# Patient Record
Sex: Female | Born: 1975 | Race: White | Hispanic: No | Marital: Married | Smoking: Former smoker
Health system: Southern US, Community
[De-identification: ages and names within clinical notes are randomized; demographics above are authoritative.]

## PROBLEM LIST (undated history)

## (undated) DIAGNOSIS — Z98811 Dental restoration status: Secondary | ICD-10-CM

## (undated) DIAGNOSIS — F329 Major depressive disorder, single episode, unspecified: Secondary | ICD-10-CM

## (undated) DIAGNOSIS — F319 Bipolar disorder, unspecified: Secondary | ICD-10-CM

## (undated) DIAGNOSIS — F909 Attention-deficit hyperactivity disorder, unspecified type: Secondary | ICD-10-CM

## (undated) DIAGNOSIS — Z9889 Other specified postprocedural states: Secondary | ICD-10-CM

## (undated) DIAGNOSIS — Z923 Personal history of irradiation: Secondary | ICD-10-CM

## (undated) DIAGNOSIS — F32A Depression, unspecified: Secondary | ICD-10-CM

## (undated) DIAGNOSIS — R112 Nausea with vomiting, unspecified: Secondary | ICD-10-CM

## (undated) DIAGNOSIS — Z853 Personal history of malignant neoplasm of breast: Secondary | ICD-10-CM

## (undated) HISTORY — DX: Depression, unspecified: F32.A

## (undated) HISTORY — PX: ORIF WRIST FRACTURE: SHX2133

## (undated) HISTORY — DX: Major depressive disorder, single episode, unspecified: F32.9

---

## 1998-06-20 ENCOUNTER — Other Ambulatory Visit: Admission: RE | Admit: 1998-06-20 | Discharge: 1998-06-20 | Payer: Self-pay | Admitting: Family Medicine

## 1998-10-31 ENCOUNTER — Emergency Department (HOSPITAL_COMMUNITY): Admission: EM | Admit: 1998-10-31 | Discharge: 1998-10-31 | Payer: Self-pay | Admitting: Internal Medicine

## 2003-03-26 ENCOUNTER — Ambulatory Visit (HOSPITAL_COMMUNITY): Admission: RE | Admit: 2003-03-26 | Discharge: 2003-03-27 | Payer: Self-pay | Admitting: Orthopedic Surgery

## 2003-10-12 ENCOUNTER — Other Ambulatory Visit: Admission: RE | Admit: 2003-10-12 | Discharge: 2003-10-12 | Payer: Self-pay | Admitting: Obstetrics and Gynecology

## 2005-02-24 ENCOUNTER — Emergency Department (HOSPITAL_COMMUNITY): Admission: EM | Admit: 2005-02-24 | Discharge: 2005-02-24 | Payer: Self-pay | Admitting: Emergency Medicine

## 2005-04-01 ENCOUNTER — Other Ambulatory Visit: Admission: RE | Admit: 2005-04-01 | Discharge: 2005-04-01 | Payer: Self-pay | Admitting: Obstetrics and Gynecology

## 2007-02-08 ENCOUNTER — Ambulatory Visit: Payer: Self-pay | Admitting: Internal Medicine

## 2007-02-09 ENCOUNTER — Ambulatory Visit: Payer: Self-pay | Admitting: *Deleted

## 2007-09-07 ENCOUNTER — Encounter (INDEPENDENT_AMBULATORY_CARE_PROVIDER_SITE_OTHER): Payer: Self-pay | Admitting: *Deleted

## 2007-10-12 ENCOUNTER — Ambulatory Visit: Payer: Self-pay | Admitting: Family Medicine

## 2007-10-13 ENCOUNTER — Encounter (INDEPENDENT_AMBULATORY_CARE_PROVIDER_SITE_OTHER): Payer: Self-pay | Admitting: Internal Medicine

## 2007-10-26 ENCOUNTER — Ambulatory Visit: Payer: Self-pay | Admitting: Family Medicine

## 2009-03-04 ENCOUNTER — Emergency Department (HOSPITAL_COMMUNITY): Admission: EM | Admit: 2009-03-04 | Discharge: 2009-03-05 | Payer: Self-pay | Admitting: Emergency Medicine

## 2011-07-24 ENCOUNTER — Ambulatory Visit (INDEPENDENT_AMBULATORY_CARE_PROVIDER_SITE_OTHER): Payer: PRIVATE HEALTH INSURANCE

## 2011-07-24 ENCOUNTER — Inpatient Hospital Stay (INDEPENDENT_AMBULATORY_CARE_PROVIDER_SITE_OTHER)
Admission: RE | Admit: 2011-07-24 | Discharge: 2011-07-24 | Disposition: A | Discharge status: Home / Self Care | Payer: PRIVATE HEALTH INSURANCE | Source: Ambulatory Visit | Attending: Emergency Medicine | Admitting: Emergency Medicine

## 2011-07-24 DIAGNOSIS — S61209A Unspecified open wound of unspecified finger without damage to nail, initial encounter: Secondary | ICD-10-CM

## 2011-08-17 ENCOUNTER — Inpatient Hospital Stay (INDEPENDENT_AMBULATORY_CARE_PROVIDER_SITE_OTHER)
Admission: RE | Admit: 2011-08-17 | Discharge: 2011-08-17 | Disposition: A | Discharge status: Home / Self Care | Payer: PRIVATE HEALTH INSURANCE | Source: Ambulatory Visit | Attending: Family Medicine | Admitting: Family Medicine

## 2011-08-17 DIAGNOSIS — S60459A Superficial foreign body of unspecified finger, initial encounter: Secondary | ICD-10-CM

## 2016-12-21 DIAGNOSIS — Z853 Personal history of malignant neoplasm of breast: Secondary | ICD-10-CM

## 2016-12-21 HISTORY — DX: Personal history of malignant neoplasm of breast: Z85.3

## 2017-03-05 ENCOUNTER — Encounter (HOSPITAL_COMMUNITY): Payer: Self-pay | Admitting: Emergency Medicine

## 2017-03-05 ENCOUNTER — Ambulatory Visit (HOSPITAL_COMMUNITY)
Admission: EM | Admit: 2017-03-05 | Discharge: 2017-03-05 | Disposition: A | Discharge status: Home / Self Care | Payer: PRIVATE HEALTH INSURANCE | Attending: Internal Medicine | Admitting: Internal Medicine

## 2017-03-05 DIAGNOSIS — R0982 Postnasal drip: Secondary | ICD-10-CM

## 2017-03-05 DIAGNOSIS — H66002 Acute suppurative otitis media without spontaneous rupture of ear drum, left ear: Secondary | ICD-10-CM

## 2017-03-05 DIAGNOSIS — J00 Acute nasopharyngitis [common cold]: Secondary | ICD-10-CM

## 2017-03-05 HISTORY — DX: Attention-deficit hyperactivity disorder, unspecified type: F90.9

## 2017-03-05 HISTORY — DX: Bipolar disorder, unspecified: F31.9

## 2017-03-05 MED ORDER — AMOXICILLIN 500 MG PO CAPS: 1000.0000 mg | ORAL_CAPSULE | Freq: Two times a day (BID) | ORAL | 0 refills | Status: DC

## 2017-03-05 NOTE — ED Triage Notes (Signed)
The patient presented to the [facility] with a complaint of a cough, sore throat and left ear pain x 2 days.

## 2017-03-05 NOTE — Discharge Instructions (Signed)
Take the amoxicillin until all gone. You have quite a bit of sinus drainage in the back of your throat which is causing throat pain, cough and hoarseness. The following medications can help with the symptoms. Sudafed PE 10 mg every 4 to 6 hours as needed for congestion Allegra or Zyrtec daily as needed for drainage and runny nose. For stronger antihistamine may take Chlor-Trimeton 2 to 4 mg every 4 to 6 hours, may cause drowsiness. Saline nasal spray used frequently. Ibuprofen 600 mg every 6 hours as needed for pain, discomfort or fever. Drink plenty of fluids and stay well-hydrated.

## 2017-03-05 NOTE — ED Provider Notes (Signed)
CSN: [id number]     Arrival date & time 03/05/17  1953 History   First MD Initiated Contact with Patient 03/05/17 2027     Chief Complaint  Patient presents with  . Sore Throat  . Otalgia   (Consider location/radiation/quality/duration/timing/severity/associated sxs/prior Treatment) 41 year old female complaining of a 2 day history of sore throat, PND, headache, feeling tired. She is unsure as to whether she had a fever or not at home she did not have a thermometer. She is afebrile here. She saw also complaining of some left ear discomfort.      Past Medical History:  Diagnosis Date  . ADHD   . Bipolar 1 disorder [facility])    Past Surgical History:  Procedure Laterality Date  . WRIST SURGERY Left    History reviewed. No pertinent family history. Social History  Substance Use Topics  . Smoking status: Current Some Day Smoker    Types: Cigarettes  . Smokeless tobacco: Current User  . Alcohol use Yes   OB History    No data available     Review of Systems  Constitutional: Negative for activity change, appetite change, chills, fatigue and fever.  HENT: Positive for congestion, ear pain, postnasal drip, rhinorrhea and sore throat. Negative for facial swelling.   Eyes: Negative.   Respiratory: Positive for cough. Negative for shortness of breath.   Cardiovascular: Negative.   Musculoskeletal: Negative for neck pain and neck stiffness.  Skin: Negative for pallor and rash.  Neurological: Negative.     Allergies  Macrobid [nitrofurantoin macrocrystal]  Home Medications   Prior to Admission medications   Medication Sig Start Date End Date Taking? Authorizing Provider  carbamazepine (EQUETRO) 200 MG CP12 12 hr capsule Take 200 mg by mouth.   Yes Historical Provider, MD  escitalopram (LEXAPRO) 10 MG tablet Take 10 mg by mouth daily.   Yes Historical Provider, MD  lisdexamfetamine (VYVANSE) 50 MG capsule Take 50 mg by mouth daily.   Yes Historical Provider, MD  amoxicillin  (AMOXIL) 500 MG capsule Take 2 capsules (1,000 mg total) by mouth 2 (two) times daily. 03/05/17   Janne Napoleon, NP   Meds Ordered and Administered this Visit  Medications - No data to display  BP 125/86 (BP Location: Right Arm)   Pulse (!) 101   Temp 98.5 F (36.9 C) (Oral)   Resp 16   SpO2 99%  No data found.   Physical Exam  Constitutional: She is oriented to person, place, and time. She appears well-developed and well-nourished. No distress.  HENT:  Mouth/Throat: No oropharyngeal exudate.  Right TM is normal. Left TM is completely erythematous with minor bulging and 1 small area of blood. Does not appear to be ruptured. Oropharynx with minor erythema and moderate amount of frothy clear PND.  Neck: Normal range of motion. Neck supple.  2 small right anterior cervical nodes.  Cardiovascular: Regular rhythm and normal heart sounds.   Pulmonary/Chest: Effort normal and breath sounds normal. No respiratory distress.  Musculoskeletal: Normal range of motion. She exhibits no edema.  Lymphadenopathy:    She has cervical adenopathy.  Neurological: She is alert and oriented to person, place, and time.  Skin: Skin is warm and dry. No rash noted.  Psychiatric: She has a normal mood and affect.  Nursing note and vitals reviewed.   Urgent Care Course     Procedures (including critical care time)  Labs Review Labs Reviewed - No data to display  Imaging Review No results found.   Visual  Acuity Review  Right Eye Distance:   Left Eye Distance:   Bilateral Distance:    Right Eye Near:   Left Eye Near:    Bilateral Near:         MDM   1. Acute suppurative otitis media of left ear without spontaneous rupture of tympanic membrane, recurrence not specified   2. Acute nasopharyngitis   3. PND (post-nasal drip)    Take the amoxicillin until all gone. You have quite a bit of sinus drainage in the back of your throat which is causing throat pain, cough and hoarseness. The  following medications can help with the symptoms. Sudafed PE 10 mg every 4 to 6 hours as needed for congestion Allegra or Zyrtec daily as needed for drainage and runny nose. For stronger antihistamine may take Chlor-Trimeton 2 to 4 mg every 4 to 6 hours, may cause drowsiness. Saline nasal spray used frequently. Ibuprofen 600 mg every 6 hours as needed for pain, discomfort or fever. Drink plenty of fluids and stay well-hydrated. Meds ordered this encounter  Medications  . carbamazepine (EQUETRO) 200 MG CP12 12 hr capsule    Sig: Take 200 mg by mouth.  . escitalopram (LEXAPRO) 10 MG tablet    Sig: Take 10 mg by mouth daily.  Marland Kitchen lisdexamfetamine (VYVANSE) 50 MG capsule    Sig: Take 50 mg by mouth daily.  Marland Kitchen amoxicillin (AMOXIL) 500 MG capsule    Sig: Take 2 capsules (1,000 mg total) by mouth 2 (two) times daily.    Dispense:  40 capsule    Refill:  0    Order Specific Question:   Supervising Provider    Answer:   Sherlene Shams [[id number]]  amoxil lnly Rx'd this visit     Janne Napoleon, NP 03/05/17 2040

## 2017-08-02 ENCOUNTER — Other Ambulatory Visit: Payer: Self-pay | Admitting: Internal Medicine

## 2017-08-02 DIAGNOSIS — N6310 Unspecified lump in the right breast, unspecified quadrant: Secondary | ICD-10-CM

## 2017-08-06 ENCOUNTER — Ambulatory Visit
Admission: RE | Admit: 2017-08-06 | Discharge: 2017-08-06 | Disposition: A | Discharge status: Home / Self Care | Payer: BLUE CROSS/BLUE SHIELD | Source: Ambulatory Visit | Attending: Internal Medicine | Admitting: Internal Medicine

## 2017-08-06 ENCOUNTER — Other Ambulatory Visit: Payer: Self-pay | Admitting: Internal Medicine

## 2017-08-06 DIAGNOSIS — N6310 Unspecified lump in the right breast, unspecified quadrant: Secondary | ICD-10-CM

## 2017-08-10 ENCOUNTER — Other Ambulatory Visit: Payer: Self-pay | Admitting: Family Medicine

## 2017-08-11 ENCOUNTER — Ambulatory Visit
Admission: RE | Admit: 2017-08-11 | Discharge: 2017-08-11 | Disposition: A | Discharge status: Home / Self Care | Payer: BLUE CROSS/BLUE SHIELD | Source: Ambulatory Visit | Attending: Internal Medicine | Admitting: Internal Medicine

## 2017-08-11 ENCOUNTER — Other Ambulatory Visit: Payer: Self-pay | Admitting: Family Medicine

## 2017-08-11 ENCOUNTER — Ambulatory Visit
Admission: RE | Admit: 2017-08-11 | Discharge: 2017-08-11 | Disposition: A | Discharge status: Home / Self Care | Payer: BLUE CROSS/BLUE SHIELD | Source: Ambulatory Visit | Attending: Family Medicine | Admitting: Family Medicine

## 2017-08-11 DIAGNOSIS — N6310 Unspecified lump in the right breast, unspecified quadrant: Secondary | ICD-10-CM

## 2017-08-12 ENCOUNTER — Telehealth: Payer: Self-pay | Admitting: *Deleted

## 2017-08-12 NOTE — Telephone Encounter (Signed)
Confirmed [facility] for 08/18/17 at 1215 .  Instructions and contact information given.

## 2017-08-17 ENCOUNTER — Other Ambulatory Visit: Payer: Self-pay | Admitting: *Deleted

## 2017-08-17 DIAGNOSIS — Z170 Estrogen receptor positive status [ER+]: Principal | ICD-10-CM

## 2017-08-17 DIAGNOSIS — C50411 Malignant neoplasm of upper-outer quadrant of right female breast: Secondary | ICD-10-CM

## 2017-08-18 ENCOUNTER — Encounter: Payer: Self-pay | Admitting: Hematology

## 2017-08-18 ENCOUNTER — Encounter: Payer: Self-pay | Admitting: Radiation Oncology

## 2017-08-18 ENCOUNTER — Ambulatory Visit: Payer: BLUE CROSS/BLUE SHIELD | Attending: General Surgery | Admitting: Physical Therapy

## 2017-08-18 ENCOUNTER — Other Ambulatory Visit: Payer: Self-pay | Admitting: *Deleted

## 2017-08-18 ENCOUNTER — Ambulatory Visit
Admission: RE | Admit: 2017-08-18 | Discharge: 2017-08-18 | Disposition: A | Discharge status: Home / Self Care | Payer: BLUE CROSS/BLUE SHIELD | Source: Ambulatory Visit | Attending: Radiation Oncology | Admitting: Radiation Oncology

## 2017-08-18 ENCOUNTER — Other Ambulatory Visit (HOSPITAL_BASED_OUTPATIENT_CLINIC_OR_DEPARTMENT_OTHER): Payer: BLUE CROSS/BLUE SHIELD

## 2017-08-18 ENCOUNTER — Ambulatory Visit (HOSPITAL_BASED_OUTPATIENT_CLINIC_OR_DEPARTMENT_OTHER): Payer: BLUE CROSS/BLUE SHIELD | Admitting: Hematology

## 2017-08-18 VITALS — BP 120/87 | HR 88 | Temp 98.5°F | Resp 20 | Ht 67.0 in | Wt 123.9 lb

## 2017-08-18 DIAGNOSIS — Z720 Tobacco use: Secondary | ICD-10-CM | POA: Insufficient documentation

## 2017-08-18 DIAGNOSIS — Z170 Estrogen receptor positive status [ER+]: Secondary | ICD-10-CM | POA: Diagnosis not present

## 2017-08-18 DIAGNOSIS — C50411 Malignant neoplasm of upper-outer quadrant of right female breast: Secondary | ICD-10-CM | POA: Insufficient documentation

## 2017-08-18 DIAGNOSIS — Z87891 Personal history of nicotine dependence: Secondary | ICD-10-CM

## 2017-08-18 DIAGNOSIS — F319 Bipolar disorder, unspecified: Secondary | ICD-10-CM | POA: Diagnosis not present

## 2017-08-18 DIAGNOSIS — F909 Attention-deficit hyperactivity disorder, unspecified type: Secondary | ICD-10-CM | POA: Insufficient documentation

## 2017-08-18 DIAGNOSIS — Z79899 Other long term (current) drug therapy: Secondary | ICD-10-CM | POA: Insufficient documentation

## 2017-08-18 DIAGNOSIS — R293 Abnormal posture: Secondary | ICD-10-CM

## 2017-08-18 DIAGNOSIS — Z809 Family history of malignant neoplasm, unspecified: Secondary | ICD-10-CM

## 2017-08-18 LAB — COMPREHENSIVE METABOLIC PANEL
ALBUMIN: 4.2 g/dL (ref 3.5–5.0)
ALK PHOS: 51 U/L (ref 40–150)
ALT: 18 U/L (ref 0–55)
AST: 18 U/L (ref 5–34)
Anion Gap: 7 mEq/L (ref 3–11)
BILIRUBIN TOTAL: 0.48 mg/dL (ref 0.20–1.20)
BUN: 19.5 mg/dL (ref 7.0–26.0)
CALCIUM: 9.4 mg/dL (ref 8.4–10.4)
CO2: 28 mEq/L (ref 22–29)
CREATININE: 0.8 mg/dL (ref 0.6–1.1)
Chloride: 107 mEq/L (ref 98–109)
EGFR: >90 mL/min/{1.73_m2} (ref >90)
Glucose: 92 mg/dl (ref 70–140)
POTASSIUM: 4.4 meq/L (ref 3.5–5.1)
Sodium: 142 mEq/L (ref 136–145)
TOTAL PROTEIN: 6.9 g/dL (ref 6.4–8.3)

## 2017-08-18 LAB — CBC WITH DIFFERENTIAL/PLATELET
BASO%: 0.8 % (ref 0.0–2.0)
BASOS ABS: 0 10*3/uL (ref 0.0–0.1)
EOS ABS: 0 10*3/uL (ref 0.0–0.5)
EOS%: 0.8 % (ref 0.0–7.0)
HEMATOCRIT: 40.7 % (ref 34.8–46.6)
HEMOGLOBIN: 13.7 g/dL (ref 11.6–15.9)
LYMPH#: 1.3 10*3/uL (ref 0.9–3.3)
LYMPH%: 33.3 % (ref 14.0–49.7)
MCH: 31.5 pg (ref 25.1–34.0)
MCHC: 33.7 g/dL (ref 31.5–36.0)
MCV: 93.5 fL (ref 79.5–101.0)
MONO#: 0.3 10*3/uL (ref 0.1–0.9)
MONO%: 8.1 % (ref 0.0–14.0)
NEUT%: 57 % (ref 38.4–76.8)
NEUTROS ABS: 2.3 10*3/uL (ref 1.5–6.5)
Platelets: 232 10*3/uL (ref 145–400)
RBC: 4.35 10*6/uL (ref 3.70–5.45)
RDW: 12.8 % (ref 11.2–14.5)
WBC: 4 10*3/uL (ref 3.9–10.3)

## 2017-08-18 NOTE — Progress Notes (Signed)
Radiation Oncology         ([phone]) [phone] ________________________________  Name: Dawn Ramos        MRN: [id number]  Date of Service: 08/18/2017 DOB: 1976/09/25  WP:[id number], Pcp Not In  Stark Klein, MD     REFERRING PHYSICIAN: Stark Klein, MD   DIAGNOSIS: The encounter diagnosis was Malignant neoplasm of upper-outer quadrant of right breast in female, estrogen receptor positive ([place]).   HISTORY OF PRESENT ILLNESS: Dawn Ramos is a 41 y.o. female seen in the multidisciplinary breast clinic for a new diagnosis of right breast cancer. The patient self palpated a mass in the right breast and proceeded with mammogram on 08/06/17 which revealed a mass in the right breast. Diagnostic imaging revealed a 2.1 x 1.5 x 1.1 cm at 11:00, and a lesoin along the axillary tail was also seen measruring 1.1 x .5 x 1.3 cm at 10:00. A biopsy of both lesions was obtained on 08/11/17 and the second lesion in the axillary tail labeled as a node did not contain nodal tissue, and was negative for disease. The breast mass was noted to have an invasive lobular carcinoma, grade 2 with the following studies: ER/PR positive, HER2 negative, Ki 10%. She comes today to discuss options of treatment for her breast cancer.    PREVIOUS RADIATION THERAPY: No   PAST MEDICAL HISTORY:  Past Medical History:  Diagnosis Date  . ADHD   . Bipolar 1 disorder ([place])        PAST SURGICAL HISTORY: Past Surgical History:  Procedure Laterality Date  . WRIST SURGERY Left      FAMILY HISTORY:  Family History  Problem Relation Age of Onset  . Lymphoma Paternal Uncle      SOCIAL HISTORY:  reports that she quit smoking 3 days ago. Her smoking use included Cigarettes. She uses smokeless tobacco. She reports that she uses drugs, including Other-see comments. She reports that she does not drink alcohol. The patient is married and lives in [place].    ALLERGIES: Macrobid [nitrofurantoin macrocrystal]   MEDICATIONS:    Current Outpatient Prescriptions  Medication Sig Dispense Refill  . carbamazepine (EQUETRO) 200 MG CP12 12 hr capsule Take 200 mg by mouth daily.     Marland Kitchen escitalopram (LEXAPRO) 10 MG tablet Take 10 mg by mouth daily.    . Flaxseed, Linseed, (FLAXSEED OIL PO) Take 1 tablet by mouth daily.    Marland Kitchen lisdexamfetamine (VYVANSE) 50 MG capsule Take 50 mg by mouth daily.    . Multiple Vitamin (MULTIVITAMIN) tablet Take 1 tablet by mouth daily.     No current facility-administered medications for this encounter.      REVIEW OF SYSTEMS: On review of systems, the patient reports that sheS is doing well overall. She denies any chest pain, shortness of breath, cough, fevers, chills, night sweats, unintended weight changes. She denies any bowel or bladder disturbances, and denies abdominal pain, nausea or vomiting. She denies any new musculoskeletal or joint aches or pains. A complete review of systems is obtained and is otherwise negative.     PHYSICAL EXAM:  Wt Readings from Last 3 Encounters:  08/18/17 123 lb 14.4 oz (56.2 kg)   Temp Readings from Last 3 Encounters:  08/18/17 98.5 F (36.9 C) (Oral)  03/05/17 98.5 F (36.9 C) (Oral)   BP Readings from Last 3 Encounters:  08/18/17 120/87  03/05/17 125/86   Pulse Readings from Last 3 Encounters:  08/18/17 88  03/05/17 (!) 101    /10  In  general this is a well appearing caucasian female in no acute distress. She is alert and oriented x4 and appropriate throughout the examination. HEENT reveals that the patient is normocephalic, atraumatic. EOMs are intact. PERRLA. Skin is intact without any evidence of gross lesions. Cardiovascular exam reveals a regular rate and rhythm, no clicks rubs or murmurs are auscultated. Chest is clear to auscultation bilaterally. Lymphatic assessment is performed and does not reveal any adenopathy in the cervical, supraclavicular, axillary, or inguinal chains. Abdomen has active bowel sounds in all quadrants and is  intact. Bilateral breast exam is performed and reveals ecchymosis of both biopsy sites in the right breast with anticipated fullness along the site of the biopsy. No mass is noted otherwise and no palpable masses are noted of the left breast. No nipple bleeding or discharge is noted. The abdomen is soft, non tender, non distended. Lower extremities are negative for pretibial pitting edema, deep calf tenderness, cyanosis or clubbing.   ECOG = 0  0 - Asymptomatic (Fully active, able to carry on all predisease activities without restriction)  1 - Symptomatic but completely ambulatory (Restricted in physically strenuous activity but ambulatory and able to carry out work of a light or sedentary nature. For example, light housework, office work)  2 - Symptomatic, <50% in bed during the day (Ambulatory and capable of all self care but unable to carry out any work activities. Up and about more than 50% of waking hours)  3 - Symptomatic, >50% in bed, but not bedbound (Capable of only limited self-care, confined to bed or chair 50% or more of waking hours)  4 - Bedbound (Completely disabled. Cannot carry on any self-care. Totally confined to bed or chair)  5 - Death   Eustace Pen MM, Creech RH, Tormey DC, et al. [phone]). "Toxicity and response criteria of the [facility]". [place] Oncol. 5 (6): 649-55    LABORATORY DATA:  Lab Results  Component Value Date   WBC 4.0 08/18/2017   HGB 13.7 08/18/2017   HCT 40.7 08/18/2017   MCV 93.5 08/18/2017   PLT 232 08/18/2017   Lab Results  Component Value Date   NA 142 08/18/2017   K 4.4 08/18/2017   CO2 28 08/18/2017   Lab Results  Component Value Date   ALT 18 08/18/2017   AST 18 08/18/2017   ALKPHOS 51 08/18/2017   BILITOT 0.48 08/18/2017      RADIOGRAPHY: US Breast Ltd Uni Right Inc Axilla  Addendum Date: 08/06/2017   ADDENDUM REPORT: 08/06/2017 13:52 ADDENDUM: RECOMMENDATION: Ultrasound-guided biopsies of the palpable  right breast mass and of the axillary tail intramammary lymph node. These biopsies are being scheduled for the patient's convenience. Electronically Signed   By: Curlene Dolphin M.D.   On: 08/06/2017 13:52   Result Date: 08/06/2017 CLINICAL DATA:  41 year old patient presents for evaluation of a palpable mass that she has noticed over the past 2 months or so in the upper-outer retroareolar right breast. She feels the mass best when she is sitting up with her right arm up. The mass is painless. Her mother has a recent history of breast cancer. EXAM: 2D DIGITAL DIAGNOSTIC BILATERAL MAMMOGRAM WITH CAD AND ADJUNCT TOMO ULTRASOUND RIGHT BREAST COMPARISON:  None ACR Breast Density Category d: The breast tissue is extremely dense, which lowers the sensitivity of mammography. FINDINGS: Metallic skin marker was placed in the region of the palpable mass in the periareolar upper outer right breast. Deep to the metallic skin marker  is an area of architectural distortion associated with an obscured mass. The mass/distortion is estimated to be 2 cm in size on mammography, but the area is indistinct due to the extremely dense breast parenchyma. There are no suspicious microcalcifications in the right breast. No lymphadenopathy is visualized in the right axilla. On the left, no mass or architectural distortion is detected. Negative for suspicious microcalcifications. Mammographic images were processed with CAD. On physical exam, there is a firm fixed mass measuring approximately 2 cm size in the retroareolar right breast 11 o'clock position. I do not palpate any axillary lymphadenopathy. Targeted ultrasound is performed, showing a hypoechoic irregular mass at 11 o'clock position retroareolar measuring 2.1 x 1.5 x 1.1 cm. The deep margin of the mass is immediately adjacent to the pectoralis muscle. A small area of vascular flow is seen within the superficial aspect of the mass. A markedly hypoechoic lymph node measuring 1.1 x 0.5 x  1.3 cm is imaged in the axillary tail of the right breast at 10 o'clock position. Ultrasound of the right axilla demonstrates a single small lymph node with a very thin cortex and normal fatty hilum. No suspicious lymph nodes are identified within the axilla. IMPRESSION: 1. Palpable 2.1 cm mass in the 11 o'clock retroareolar right breast is highly suspicious for malignancy. 2. 1.3 cm markedly hypoechoic lymph node in the 10 o'clock position of the right breast axillary tail is indeterminate. Involvement with metastatic carcinoma cannot be excluded. 3. No suspicious lymph nodes are identified within the right axilla. 4. Extremely dense breast parenchyma. If breast biopsy is positive for malignancy, breast MRI is suggested. 5. No evidence of malignancy in the left breast. RECOMMENDATION: 5: Highly suggestive of malignancy. I have discussed the findings and recommendations with the patient. Results were also provided in writing at the conclusion of the visit. If applicable, a reminder letter will be sent to the patient regarding the next appointment. BI-RADS CATEGORY  5: Highly suggestive of malignancy. Electronically Signed: By: Curlene Dolphin M.D. On: 08/06/2017 11:08   Mm Diag Breast Tomo Bilateral  Addendum Date: 08/06/2017   ADDENDUM REPORT: 08/06/2017 13:52 ADDENDUM: RECOMMENDATION: Ultrasound-guided biopsies of the palpable right breast mass and of the axillary tail intramammary lymph node. These biopsies are being scheduled for the patient's convenience. Electronically Signed   By: Curlene Dolphin M.D.   On: 08/06/2017 13:52   Result Date: 08/06/2017 CLINICAL DATA:  41 year old patient presents for evaluation of a palpable mass that she has noticed over the past 2 months or so in the upper-outer retroareolar right breast. She feels the mass best when she is sitting up with her right arm up. The mass is painless. Her mother has a recent history of breast cancer. EXAM: 2D DIGITAL DIAGNOSTIC BILATERAL MAMMOGRAM  WITH CAD AND ADJUNCT TOMO ULTRASOUND RIGHT BREAST COMPARISON:  None ACR Breast Density Category d: The breast tissue is extremely dense, which lowers the sensitivity of mammography. FINDINGS: Metallic skin marker was placed in the region of the palpable mass in the periareolar upper outer right breast. Deep to the metallic skin marker is an area of architectural distortion associated with an obscured mass. The mass/distortion is estimated to be 2 cm in size on mammography, but the area is indistinct due to the extremely dense breast parenchyma. There are no suspicious microcalcifications in the right breast. No lymphadenopathy is visualized in the right axilla. On the left, no mass or architectural distortion is detected. Negative for suspicious microcalcifications. Mammographic images were processed with  CAD. On physical exam, there is a firm fixed mass measuring approximately 2 cm size in the retroareolar right breast 11 o'clock position. I do not palpate any axillary lymphadenopathy. Targeted ultrasound is performed, showing a hypoechoic irregular mass at 11 o'clock position retroareolar measuring 2.1 x 1.5 x 1.1 cm. The deep margin of the mass is immediately adjacent to the pectoralis muscle. A small area of vascular flow is seen within the superficial aspect of the mass. A markedly hypoechoic lymph node measuring 1.1 x 0.5 x 1.3 cm is imaged in the axillary tail of the right breast at 10 o'clock position. Ultrasound of the right axilla demonstrates a single small lymph node with a very thin cortex and normal fatty hilum. No suspicious lymph nodes are identified within the axilla. IMPRESSION: 1. Palpable 2.1 cm mass in the 11 o'clock retroareolar right breast is highly suspicious for malignancy. 2. 1.3 cm markedly hypoechoic lymph node in the 10 o'clock position of the right breast axillary tail is indeterminate. Involvement with metastatic carcinoma cannot be excluded. 3. No suspicious lymph nodes are  identified within the right axilla. 4. Extremely dense breast parenchyma. If breast biopsy is positive for malignancy, breast MRI is suggested. 5. No evidence of malignancy in the left breast. RECOMMENDATION: 5: Highly suggestive of malignancy. I have discussed the findings and recommendations with the patient. Results were also provided in writing at the conclusion of the visit. If applicable, a reminder letter will be sent to the patient regarding the next appointment. BI-RADS CATEGORY  5: Highly suggestive of malignancy. Electronically Signed: By: Curlene Dolphin M.D. On: 08/06/2017 11:08   Mm Clip Placement Right  Result Date: 08/11/2017 CLINICAL DATA:  Evaluate biopsy markers EXAM: DIAGNOSTIC RIGHT MAMMOGRAM POST ULTRASOUND BIOPSY COMPARISON:  Previous exam(s). FINDINGS: Mammographic images were obtained following ultrasound guided biopsy of a right axillary tail lymph node and a right breast mass. The ribbon shaped biopsy clip is in the region of the biopsied mass. The clip placed into the lymph node was not visualized, likely too deep within the breast to pull into view, on this study. IMPRESSION: Clip placement as above. Final Assessment: Post Procedure Mammograms for Marker Placement Electronically Signed   By: Dorise Bullion III M.D   On: 08/11/2017 16:37   [place] Rt Breast Bx W Loc Dev 1st Lesion Img Bx Spec US Guide  Addendum Date: 08/13/2017   ADDENDUM REPORT: 08/12/2017 15:00 ADDENDUM: Pathology revealed GRADE II INVASIVE AND IN SITU MAMMARY CARCINOMA of the Right breast, 11:00 o'clock. FIBROCYSTIC CHANGES of the Right breast, 10:00 o'clock lymph node. This was found to be concordant by Dr. Dorise Bullion. Pathology results were discussed with the patient by telephone. The patient reported doing well after the biopsies with tenderness at the sites. Post biopsy instructions and care were reviewed and questions were answered. The patient was encouraged to call [facility]  for any additional concerns. The patient was referred to [facility] on August 18, 2017. Recommendation for bilateral breast MRI due to extremely dense breast tissue. Pathology results reported by Terie Purser, RN on 08/12/2017. Electronically Signed   By: Dorise Bullion III M.D   On: 08/12/2017 15:00   Result Date: 08/13/2017 CLINICAL DATA:  Right breast mass biopsy EXAM: ULTRASOUND GUIDED RIGHT BREAST CORE NEEDLE BIOPSY COMPARISON:  Previous exam(s). FINDINGS: I met with the patient and we discussed the procedure of ultrasound-guided biopsy, including benefits and alternatives.  We discussed the high likelihood of a successful procedure. We discussed the risks of the procedure, including infection, bleeding, tissue injury, clip migration, and inadequate sampling. Informed written consent was given. The usual time-out protocol was performed immediately prior to the procedure. Lesion quadrant: Upper-outer Using sterile technique and 1% Lidocaine as local anesthetic, under direct ultrasound visualization, a 12 gauge spring-loaded device was used to perform biopsy of a right breast mass using a lateral approach. At the conclusion of the procedure a tissue marker clip was deployed into the biopsy cavity. Follow up 2 view mammogram was performed and dictated separately. IMPRESSION: Ultrasound guided biopsy of a right breast mass. No apparent complications. Electronically Signed: By: Dorise Bullion III M.D On: 08/11/2017 15:51   [place] Rt Breast Bx W Loc Dev Ea Add Lesion Img Bx Spec US Guide  Addendum Date: 08/13/2017   ADDENDUM REPORT: 08/12/2017 15:00 ADDENDUM: Pathology revealed GRADE II INVASIVE AND IN SITU MAMMARY CARCINOMA of the Right breast, 11:00 o'clock. FIBROCYSTIC CHANGES of the Right breast, 10:00 o'clock lymph node. This was found to be concordant by Dr. Dorise Bullion. Pathology results were discussed with the patient by  telephone. The patient reported doing well after the biopsies with tenderness at the sites. Post biopsy instructions and care were reviewed and questions were answered. The patient was encouraged to call [facility] for any additional concerns. The patient was referred to [facility] on August 18, 2017. Recommendation for bilateral breast MRI due to extremely dense breast tissue. Pathology results reported by Terie Purser, RN on 08/12/2017. Electronically Signed   By: Dorise Bullion III M.D   On: 08/12/2017 15:00   Result Date: 08/13/2017 CLINICAL DATA:  Biopsy of axillary tail lymph node. EXAM: ULTRASOUND GUIDED RIGHT BREAST CORE NEEDLE BIOPSY COMPARISON:  Previous exam(s). FINDINGS: I met with the patient and we discussed the procedure of ultrasound-guided biopsy, including benefits and alternatives. We discussed the high likelihood of a successful procedure. We discussed the risks of the procedure, including infection, bleeding, tissue injury, clip migration, and inadequate sampling. Informed written consent was given. The usual time-out protocol was performed immediately prior to the procedure. Lesion quadrant: Upper-outer Using sterile technique and 1% Lidocaine as local anesthetic, under direct ultrasound visualization, a 14 gauge spring-loaded device was used to perform biopsy of a lymph node in the right axillary tail using a lateral approach. At the conclusion of the procedure a HydroMARK tissue marker clip was deployed into the biopsy cavity. Follow up 2 view mammogram was performed and dictated separately. IMPRESSION: Ultrasound guided biopsy of an axillary tail lymph node. No apparent complications. Electronically Signed: By: Dorise Bullion III M.D On: 08/11/2017 15:52       IMPRESSION/PLAN: 1. 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 Dr. Lisbeth Renshaw  discusses the pathology findings and reviews the nature of invasive breast disease. The consensus from the breast conference included MRI to assess extent of disease as well as genetic counseling. This information if positive may impact surgical decision making. She would be offered sentinel node evaluation regardless of surgical approach for lumpectomy or mastectomy. Her tumor will also be sent for oncotype or mammaprint testing to determine a role for chemotherapy. We discussed that if she had lumpectomy, or high risk features on final pathology from mastectomy, then external radiotherapy to the breast would be recommended, followed by antiestrogen therapy. We discussed the  risks, benefits, short, and long term effects of radiotherapy, and the patient is interested in proceeding. Dr. Lisbeth Renshaw discusses the delivery and logistics of radiotherapy and would recommend 6 1/2 weeks of treatment. We will be happy to see her and discuss treatment as indicated.  2. Possible genetic predisposition to malignancy. The patient will meet with genetic counseling to move forward with testing. We will follow up with this expectantly.  The above documentation reflects my direct findings during this shared patient visit. Please see the separate note by Dr. Lisbeth Renshaw on this date for the remainder of the patient's plan of care.    Carola Rhine, PAC

## 2017-08-18 NOTE — Addendum Note (Signed)
Encounter addended by: Hayden Pedro, PA-C on: 08/18/2017  2:54 PM<BR>    Actions taken: Sign clinical note

## 2017-08-18 NOTE — Progress Notes (Signed)
Nutrition Assessment  Reason for Assessment:  Pt seen in Breast Clinic  ASSESSMENT:   41 year old female with breast cancer.  Past medical history reviewed.  Medications:  reviewed  Labs: reviewed  Anthropometrics:   Height: 67 inches Weight: 123 lb 14.4 oz BMI: 19.4   NUTRITION DIAGNOSIS: Food and nutrition related knowledge deficit related to new diagnosis of breast cancer as evidenced by no prior need for nutrition related information.  INTERVENTION:   Discussed and provided packet of information regarding nutritional tips for breast cancer patients.  Questions answered.  Teachback method used.  Contact information provided and patient knows to contact me with questions/concerns.    MONITORING, EVALUATION, and GOAL: Pt will consume a healthy plant based diet to maintain lean body mass throughout treatment.   Shenaya Lebo B. Zenia Resides, [place], [place] Registered Dietitian [phone] (pager)

## 2017-08-18 NOTE — Addendum Note (Signed)
Addended by: Truitt Merle on: 08/18/2017 10:26 PM   Modules accepted: Orders

## 2017-08-18 NOTE — Therapy (Signed)
[facility] [address] [place], [place], [address] Phone: [phone]   Fax:  [phone]  Physical Therapy Evaluation  Patient Details  Name: Ayonna Speranza MRN: [id number] Date of Birth: 1976-02-15 Referring Provider: Dr. Almond Lint  Encounter Date: 08/18/2017      PT End of Session - 08/18/17 1629    Visit Number 1   Number of Visits 1   PT Start Time 1355   PT Stop Time 1420   PT Time Calculation (min) 25 min   Activity Tolerance Patient tolerated treatment well   Behavior During Therapy [facility] for tasks assessed/performed      Past Medical History:  Diagnosis Date  . ADHD   . Bipolar 1 disorder (HCC)   . Depression     Past Surgical History:  Procedure Laterality Date  . WRIST SURGERY Left     There were no vitals filed for this visit.       Subjective Assessment - 08/18/17 1624    Subjective Patient reports she is here today to be seen by her medical team for her newly diagnosed right breast cancer.   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 It measures 2.1 cm and is located in the upper outer quadrant. It is ER/PR positive and HER2 negative with a Ki67 of 10%.    Patient Stated Goals Reduce lymphedema risk and learn post op shoulder ROM HEP   Currently in Pain? No/denies            [facility] PT Assessment - 08/18/17 0001      Assessment   Medical Diagnosis Right breast cancer   Referring Provider Dr. Almond Lint   Onset Date/Surgical Date 08/12/17   Hand Dominance Right   Prior Therapy none     Precautions   Precautions Other (comment)   Precaution Comments active cancer     Restrictions   Weight Bearing Restrictions No     Balance Screen   Has the patient fallen in the past 6 months No   Has the patient had a decrease in activity level because of a fear of falling?  No   Is the patient reluctant to leave their home because of a fear of falling?  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    Leisure Rides her bike > 30 min.day     Cognition   Overall Cognitive Status Within Functional Limits for tasks assessed     Posture/Postural Control   Posture/Postural Control Postural limitations   Postural Limitations Rounded Shoulders;Forward head     ROM / Strength   AROM / PROM / Strength AROM;Strength     AROM   AROM Assessment Site Shoulder;Cervical   Right/Left Shoulder Left;Right   Right Shoulder Extension 59 Degrees   Right Shoulder Flexion 135 Degrees   Right Shoulder ABduction 153 Degrees   Right Shoulder Internal Rotation 74 Degrees   Right Shoulder External Rotation 79 Degrees   Left Shoulder Extension 75 Degrees   Left Shoulder Flexion 140 Degrees   Left Shoulder ABduction  157 Degrees   Left Shoulder Internal Rotation 76 Degrees   Left Shoulder External Rotation 86 Degrees   Cervical Flexion WNL   Cervical Extension WNL   Cervical - Right Side Bend WNL   Cervical - Left Side Bend WNL   Cervical - Right Rotation WNL   Cervical - Left Rotation WNL     Strength   Overall Strength Within functional limits for tasks performed           LYMPHEDEMA/ONCOLOGY QUESTIONNAIRE - 08/18/17 1628      Type   Cancer Type Right breast cancer     Lymphedema Assessments   Lymphedema Assessments Upper extremities     Right Upper Extremity Lymphedema   10 cm Proximal to Olecranon Process 23.5 cm   Olecranon Process 22.7 cm   10 cm Proximal to Ulnar Styloid Process 21 cm   Just Proximal to Ulnar Styloid Process 15.5 cm   Across Hand at [employer] 18.2 cm   At [place] of 2nd Digit 6.1 cm     Left Upper Extremity Lymphedema   10 cm Proximal to Olecranon Process 23.4 cm   Olecranon Process 22.6 cm   10 cm Proximal to Ulnar Styloid Process 20.5 cm   Just Proximal to Ulnar Styloid Process 16 cm   Across Hand at [employer] 18.5 cm   At [place] of 2nd Digit 6.1 cm         Objective measurements completed on examination: See above findings.     Patient was instructed today in a home exercise program today for post op shoulder range of motion. These included active assist shoulder flexion in sitting, scapular retraction, wall walking with shoulder abduction, and hands behind head external rotation.  She was encouraged to do these twice a day, holding 3 seconds and repeating 5 times when permitted by her physician.         PT Education - 08/18/17 1629    Education provided Yes   Education Details Lymphedema risk reduction and post op shoulder ROM HEP   Person(s) Educated Patient;Parent(s)   Methods Explanation;Demonstration;Handout   Comprehension Returned demonstration;Verbalized understanding              Breast Clinic Goals - 08/18/17 1632      Patient will be able to verbalize understanding of pertinent lymphedema risk reduction practices relevant to her diagnosis specifically related to skin care.   Time 1   Period Days   Status Achieved     Patient will be able to return demonstrate and/or verbalize understanding of the post-op home exercise program related to regaining shoulder range of motion.   Time 1   Period Days   Status Achieved     Patient will be able to verbalize understanding of the importance of attending the postoperative After Breast Cancer Class for further lymphedema risk reduction education and therapeutic exercise.   Time 1   Period Days   Status Achieved               Plan - 08/18/17 1630    Clinical Impression Statement Patient was diagnosed on 08/12/17 with right  grade 2 invasive lobular carcinoma breast cancer. It measures 2.1 cm and is located in the upper outer quadrant. It is ER/PR positive and HER2 negative with a Ki67 of 10%.  Her multidisciplinary medical team met prior to her assessments to determine a recommended treatment plan. She is planning to have genetic testing and depending on those results  either a right lumpectomy or mastectomy (possibly bilaterally) with a sentinel node biopsy followed by radiation (if lumpectomy) and anti-estrogen therapy. She may benefit from post op PT to regain shoulder ROM and reduce lymphedema risk.   History and Personal Factors relevant to plan of care: None   Clinical Presentation Stable   Clinical Decision Making Low   Rehab Potential Excellent   Clinical Impairments Affecting Rehab Potential None   PT Frequency One time visit   PT Treatment/Interventions Patient/family education;Therapeutic exercise   PT Next Visit Plan Will f/u after surgery to determine PT needs   PT Home Exercise Plan Post op shoulder ROM HEP   Consulted and Agree with Plan of Care Patient;Family member/caregiver   Family Member Consulted Parents      Patient will benefit from skilled therapeutic intervention in order to improve the following deficits and impairments:  Postural dysfunction, Decreased knowledge of precautions, Pain, Impaired UE functional use, Decreased range of motion  Visit Diagnosis: Carcinoma of upper-outer quadrant of right breast in female, estrogen receptor positive ([place]) - Plan: PT plan of care cert/re-cert  Abnormal posture - Plan: PT plan of care cert/re-cert   Patient will follow up at outpatient cancer rehab if needed following surgery.  If the patient requires physical therapy at that time, a specific plan will be dictated and sent to the referring physician for approval. The patient was educated today on appropriate basic range of motion exercises to begin post operatively and the importance of attending  the After Breast Cancer class following surgery.  Patient was educated today on lymphedema risk reduction practices as it pertains to recommendations that will benefit the patient immediately following surgery.  She verbalized good understanding.  No additional physical therapy is indicated at this time.     Problem List Patient Active Problem List   Diagnosis Date Noted  . Malignant neoplasm of upper-outer quadrant of right breast in female, estrogen receptor positive ([place]) 08/17/2017   Annia Friendly, PT 08/18/17 4:34 PM  [facility] [place], [place], [address] Phone: [phone]   Fax:  [phone]  Name: Ritha Sampedro MRN: [id number] Date of Birth: November 28, 1976

## 2017-08-18 NOTE — Patient Instructions (Signed)

## 2017-08-18 NOTE — Progress Notes (Addendum)
[place]  [phone] Fax:([phone]) [place] Note   Patient Care Team: System, Pcp Not In as PCP - General Truitt Merle, MD as Consulting Physician (Hematology) Stark Klein, MD as Consulting Physician (General Surgery) Kyung Rudd, MD as Consulting Physician (Radiation Oncology) 08/18/2017  CHIEF COMPLAINTS/PURPOSE OF CONSULTATION:  Malignant neoplasm of upper-outer quadrant of right breast in female, estrogen receptor positive   Oncology History   Cancer Staging Malignant neoplasm of upper-outer quadrant of right breast in female, estrogen receptor positive ([place]) Staging form: Breast, AJCC 8th Edition - Clinical stage from 08/11/2017: Stage IB (cT2, cN0, cM0, G2, ER: Positive, PR: Positive, HER2: Negative) - Signed by Truitt Merle, MD on 08/17/2017       Malignant neoplasm of upper-outer quadrant of right breast in female, estrogen receptor positive ([place])   08/06/2017 Mammogram    IMPRESSION: 1. Palpable 2.1 cm mass in the 11 o'clock retroareolar right breast is highly suspicious for malignancy. 2. 1.3 cm markedly hypoechoic lymph node in the 10 o'clock position of the right breast axillary tail is indeterminate. Involvement with metastatic carcinoma cannot be excluded. 3. No suspicious lymph nodes are identified within the right axilla. 4. Extremely dense breast parenchyma. If breast biopsy is positive for malignancy, breast MRI is suggested. 5. No evidence of malignancy in the left breast.       08/11/2017 Receptors her2    Estrogen Receptor: 100%, POSITIVE, STRONG STAINING INTENSITY Progesterone Receptor: 100%, POSITIVE, STRONG STAINING INTENSITY Proliferation Marker Ki67: 10% HER2 NEGATIVE       08/11/2017 Initial Biopsy    Diagnosis 1. Breast, right, needle core biopsy, 11:00 o'clock - INVASIVE AND IN SITU LOBULAR CARCINOMA. - SEE COMMENT. 2. Breast, right, needle core biopsy, 10:00 o'clock lymph node - FIBROCYSTIC  CHANGES. - LYMPH NODAL TISSUE IS NOT IDENTIFIED. - THERE IS NO EVIDENCE OF MALIGNANCY.      08/17/2017 Initial Diagnosis    Malignant neoplasm of upper-outer quadrant of right breast in female, estrogen receptor positive ([place])       HISTORY OF PRESENTING ILLNESS: 08/18/17  Dawn Ramos 41 y.o. female is here because of newly diagnosed right breast cancer. She presents to the clinic today with her mother and father.  She felt the lump 2 months ago and she does not think it has changed since then. She had not had mammogram before. She did not notice in change in weight, appetite, body or energy.   In the past she was diagnosed with bipolar 1 disorder. She is on medication and sees a psychiatrist since 2003. She has not had any hospitalization. She is ADHD as well and uses Vyvance. She had surgery on her left wrist and has a place placed in it. Her uncle had hodgkin's lymphoma and melanoma.   Today she reports She quit smoking 2 days ago. Her husband is a smoker. 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 She does not know if she plans on having kids. She reports to having a brother in [place] and her parents are in near by.    GYN HISTORY  Menarchal: 13 LMP: 08/17/17 Contraceptive: IUD 12 years ago HRT: No GP: [id number], 3 abortions and 1 miscarriage    MEDICAL HISTORY:  Past Medical History:  Diagnosis Date  . ADHD   . Bipolar 1 disorder ([place])   . Depression     SURGICAL HISTORY: Past Surgical History:  Procedure Laterality Date  . WRIST SURGERY Left  SOCIAL HISTORY: Social History   Social History  . Marital status: Married    Spouse name: N/A  . Number of children: N/A  . Years of education: N/A   Occupational History  . Not on file.   Social History Main Topics  . Smoking status: Current Every Day Smoker    Packs/day: 0.50    Years: 17.00    Types: Cigarettes  . Smokeless tobacco: Current User  . Alcohol use No  . Drug use: Yes     Types: Other-see comments     Comment: socially/occas/ shrooms  . Sexual activity: Not on file   Other Topics Concern  . Not on file   Social History Narrative  . No narrative on file    FAMILY HISTORY: Family History  Problem Relation Age of Onset  . Lymphoma Paternal Uncle     ALLERGIES:  is allergic to macrobid [nitrofurantoin macrocrystal].  MEDICATIONS:  Current Outpatient Prescriptions  Medication Sig Dispense Refill  . carbamazepine (EQUETRO) 200 MG CP12 12 hr capsule Take 200 mg by mouth daily.     Marland Kitchen escitalopram (LEXAPRO) 10 MG tablet Take 10 mg by mouth daily.    . Flaxseed, Linseed, (FLAXSEED OIL PO) Take 1 tablet by mouth daily.    Marland Kitchen lisdexamfetamine (VYVANSE) 50 MG capsule Take 50 mg by mouth daily.    . Multiple Vitamin (MULTIVITAMIN) tablet Take 1 tablet by mouth daily.     No current facility-administered medications for this visit.     REVIEW OF SYSTEMS:   Constitutional: Denies fevers, chills or abnormal night sweats Eyes: Denies blurriness of vision, double vision or watery eyes Ears, nose, mouth, throat, and face: Denies mucositis or sore throat Respiratory: Denies cough, dyspnea or wheezes Cardiovascular: Denies palpitation, chest discomfort or lower extremity swelling Gastrointestinal:  Denies nausea, heartburn or change in bowel habits Skin: Denies abnormal skin rashes Lymphatics: Denies new lymphadenopathy or easy bruising Neurological:Denies numbness, tingling or new weaknesses Behavioral/Psych: Mood is stable, no new changes  Breast: (+) palpable mass in right breast  All other systems were reviewed with the patient and are negative.  PHYSICAL EXAMINATION: ECOG PERFORMANCE STATUS: 0 - Asymptomatic  Vitals:   08/18/17 1255  BP: 120/87  Pulse: 88  Resp: 20  Temp: 98.5 F (36.9 C)  SpO2: 100%   Filed Weights   08/18/17 1255  Weight: 123 lb 14.4 oz (56.2 kg)    GENERAL:alert, no distress and comfortable SKIN: skin color, texture,  turgor are normal, no rashes or significant lesions EYES: normal, conjunctiva are pink and non-injected, sclera clear OROPHARYNX:no exudate, no erythema and lips, buccal mucosa, and tongue normal  NECK: supple, thyroid normal size, non-tender, without nodularity LYMPH:  no palpable lymphadenopathy in the cervical, axillary or inguinal LUNGS: clear to auscultation and percussion with normal breathing effort HEART: regular rate & rhythm and no murmurs and no lower extremity edema ABDOMEN:abdomen soft, non-tender and normal bowel sounds Musculoskeletal:no cyanosis of digits and no clubbing  PSYCH: alert & oriented x 3 with fluent speech NEURO: no focal motor/sensory deficits Breasts: Breast inspection showed them to be symmetrical with no nipple discharge. Palpation of the breasts and axilla revealed no obvious mass that I could appreciate except a (+) palpable mass 1x2 cm in 11:00 position in upper right breast next to areola, moveable  LABORATORY DATA:  I have reviewed the data as listed CBC Latest Ref Rng & Units 08/18/2017  WBC 3.9 - 10.3 10e3/uL 4.0  Hemoglobin 11.6 - 15.9 g/dL  13.7  Hematocrit 34.8 - 46.6 % 40.7  Platelets 145 - 400 10e3/uL 232    CMP Latest Ref Rng & Units 08/18/2017  Glucose 70 - 140 mg/dl 92  BUN 7.0 - 26.0 mg/dL 19.5  Creatinine 0.6 - 1.1 mg/dL 0.8  Sodium 136 - 145 mEq/L 142  Potassium 3.5 - 5.1 mEq/L 4.4  CO2 22 - 29 mEq/L 28  Calcium 8.4 - 10.4 mg/dL 9.4  Total Protein 6.4 - 8.3 g/dL 6.9  Total Bilirubin 0.20 - 1.20 mg/dL 0.48  Alkaline Phos 40 - 150 U/L 51  AST 5 - 34 U/L 18  ALT 0 - 55 U/L 18     PATHOLOGY  Diagnosis 08/11/17 1. Breast, right, needle core biopsy, 11:00 o'clock - INVASIVE AND IN SITU MAMMARY CARCINOMA. - SEE COMMENT. 2. Breast, right, needle core biopsy, 10:00 o'clock lymph node - FIBROCYSTIC CHANGES. - LYMPH NODAL TISSUE IS NOT IDENTIFIED. - THERE IS NO EVIDENCE OF MALIGNANCY. - SEE COMMENT. Microscopic Comment 1. , 2. The  carcinoma appears grade II. An E-cadherin and a breast prognostic profile will be performed and the results reported separately. The results were called to [facility] on 08/12/2017. (JBK:ecj 08/12/2017) 1. PROGNOSTIC INDICATORS Results: IMMUNOHISTOCHEMICAL AND MORPHOMETRIC ANALYSIS PERFORMED MANUALLY Estrogen Receptor: 100%, POSITIVE, STRONG STAINING INTENSITY Progesterone Receptor: 100%, POSITIVE, STRONG STAINING INTENSITY Proliferation Marker Ki67: 10% 1. FLUORESCENCE IN-SITU HYBRIDIZATION Results: HER2 - NEGATIVE RATIO OF HER2/CEP17 SIGNALS 1.41 AVERAGE HER2 COPY NUMBER PER CELL 2.00    RADIOGRAPHIC STUDIES: I have personally reviewed the radiological images as listed and agreed with the findings in the report. US Breast Ltd Uni Right Inc Axilla  Addendum Date: 08/06/2017   ADDENDUM REPORT: 08/06/2017 13:52 ADDENDUM: RECOMMENDATION: Ultrasound-guided biopsies of the palpable right breast mass and of the axillary tail intramammary lymph node. These biopsies are being scheduled for the patient's convenience. Electronically Signed   By: Curlene Dolphin M.D.   On: 08/06/2017 13:52   Result Date: 08/06/2017 CLINICAL DATA:  41 year old patient presents for evaluation of a palpable mass that she has noticed over the past 2 months or so in the upper-outer retroareolar right breast. She feels the mass best when she is sitting up with her right arm up. The mass is painless. Her mother has a recent history of breast cancer. EXAM: 2D DIGITAL DIAGNOSTIC BILATERAL MAMMOGRAM WITH CAD AND ADJUNCT TOMO ULTRASOUND RIGHT BREAST COMPARISON:  None ACR Breast Density Category d: The breast tissue is extremely dense, which lowers the sensitivity of mammography. FINDINGS: Metallic skin marker was placed in the region of the palpable mass in the periareolar upper outer right breast. Deep to the metallic skin marker is an area of architectural distortion associated with an obscured mass. The  mass/distortion is estimated to be 2 cm in size on mammography, but the area is indistinct due to the extremely dense breast parenchyma. There are no suspicious microcalcifications in the right breast. No lymphadenopathy is visualized in the right axilla. On the left, no mass or architectural distortion is detected. Negative for suspicious microcalcifications. Mammographic images were processed with CAD. On physical exam, there is a firm fixed mass measuring approximately 2 cm size in the retroareolar right breast 11 o'clock position. I do not palpate any axillary lymphadenopathy. Targeted ultrasound is performed, showing a hypoechoic irregular mass at 11 o'clock position retroareolar measuring 2.1 x 1.5 x 1.1 cm. The deep margin of the mass is immediately adjacent to the pectoralis muscle. A small area of vascular flow  is seen within the superficial aspect of the mass. A markedly hypoechoic lymph node measuring 1.1 x 0.5 x 1.3 cm is imaged in the axillary tail of the right breast at 10 o'clock position. Ultrasound of the right axilla demonstrates a single small lymph node with a very thin cortex and normal fatty hilum. No suspicious lymph nodes are identified within the axilla. IMPRESSION: 1. Palpable 2.1 cm mass in the 11 o'clock retroareolar right breast is highly suspicious for malignancy. 2. 1.3 cm markedly hypoechoic lymph node in the 10 o'clock position of the right breast axillary tail is indeterminate. Involvement with metastatic carcinoma cannot be excluded. 3. No suspicious lymph nodes are identified within the right axilla. 4. Extremely dense breast parenchyma. If breast biopsy is positive for malignancy, breast MRI is suggested. 5. No evidence of malignancy in the left breast. RECOMMENDATION: 5: Highly suggestive of malignancy. I have discussed the findings and recommendations with the patient. Results were also provided in writing at the conclusion of the visit. If applicable, a reminder letter will be  sent to the patient regarding the next appointment. BI-RADS CATEGORY  5: Highly suggestive of malignancy. Electronically Signed: By: Curlene Dolphin M.D. On: 08/06/2017 11:08   Mm Diag Breast Tomo Bilateral  Addendum Date: 08/06/2017   ADDENDUM REPORT: 08/06/2017 13:52 ADDENDUM: RECOMMENDATION: Ultrasound-guided biopsies of the palpable right breast mass and of the axillary tail intramammary lymph node. These biopsies are being scheduled for the patient's convenience. Electronically Signed   By: Curlene Dolphin M.D.   On: 08/06/2017 13:52   Result Date: 08/06/2017 CLINICAL DATA:  41 year old patient presents for evaluation of a palpable mass that she has noticed over the past 2 months or so in the upper-outer retroareolar right breast. She feels the mass best when she is sitting up with her right arm up. The mass is painless. Her mother has a recent history of breast cancer. EXAM: 2D DIGITAL DIAGNOSTIC BILATERAL MAMMOGRAM WITH CAD AND ADJUNCT TOMO ULTRASOUND RIGHT BREAST COMPARISON:  None ACR Breast Density Category d: The breast tissue is extremely dense, which lowers the sensitivity of mammography. FINDINGS: Metallic skin marker was placed in the region of the palpable mass in the periareolar upper outer right breast. Deep to the metallic skin marker is an area of architectural distortion associated with an obscured mass. The mass/distortion is estimated to be 2 cm in size on mammography, but the area is indistinct due to the extremely dense breast parenchyma. There are no suspicious microcalcifications in the right breast. No lymphadenopathy is visualized in the right axilla. On the left, no mass or architectural distortion is detected. Negative for suspicious microcalcifications. Mammographic images were processed with CAD. On physical exam, there is a firm fixed mass measuring approximately 2 cm size in the retroareolar right breast 11 o'clock position. I do not palpate any axillary lymphadenopathy. Targeted  ultrasound is performed, showing a hypoechoic irregular mass at 11 o'clock position retroareolar measuring 2.1 x 1.5 x 1.1 cm. The deep margin of the mass is immediately adjacent to the pectoralis muscle. A small area of vascular flow is seen within the superficial aspect of the mass. A markedly hypoechoic lymph node measuring 1.1 x 0.5 x 1.3 cm is imaged in the axillary tail of the right breast at 10 o'clock position. Ultrasound of the right axilla demonstrates a single small lymph node with a very thin cortex and normal fatty hilum. No suspicious lymph nodes are identified within the axilla. IMPRESSION: 1. Palpable 2.1 cm mass in  the 11 o'clock retroareolar right breast is highly suspicious for malignancy. 2. 1.3 cm markedly hypoechoic lymph node in the 10 o'clock position of the right breast axillary tail is indeterminate. Involvement with metastatic carcinoma cannot be excluded. 3. No suspicious lymph nodes are identified within the right axilla. 4. Extremely dense breast parenchyma. If breast biopsy is positive for malignancy, breast MRI is suggested. 5. No evidence of malignancy in the left breast. RECOMMENDATION: 5: Highly suggestive of malignancy. I have discussed the findings and recommendations with the patient. Results were also provided in writing at the conclusion of the visit. If applicable, a reminder letter will be sent to the patient regarding the next appointment. BI-RADS CATEGORY  5: Highly suggestive of malignancy. Electronically Signed: By: Curlene Dolphin M.D. On: 08/06/2017 11:08   Mm Clip Placement Right  Result Date: 08/11/2017 CLINICAL DATA:  Evaluate biopsy markers EXAM: DIAGNOSTIC RIGHT MAMMOGRAM POST ULTRASOUND BIOPSY COMPARISON:  Previous exam(s). FINDINGS: Mammographic images were obtained following ultrasound guided biopsy of a right axillary tail lymph node and a right breast mass. The ribbon shaped biopsy clip is in the region of the biopsied mass. The clip placed into the lymph  node was not visualized, likely too deep within the breast to pull into view, on this study. IMPRESSION: Clip placement as above. Final Assessment: Post Procedure Mammograms for Marker Placement Electronically Signed   By: Dorise Bullion III M.D   On: 08/11/2017 16:37   [place] Rt Breast Bx W Loc Dev 1st Lesion Img Bx Spec US Guide  Addendum Date: 08/13/2017   ADDENDUM REPORT: 08/12/2017 15:00 ADDENDUM: Pathology revealed GRADE II INVASIVE AND IN SITU MAMMARY CARCINOMA of the Right breast, 11:00 o'clock. FIBROCYSTIC CHANGES of the Right breast, 10:00 o'clock lymph node. This was found to be concordant by Dr. Dorise Bullion. Pathology results were discussed with the patient by telephone. The patient reported doing well after the biopsies with tenderness at the sites. Post biopsy instructions and care were reviewed and questions were answered. The patient was encouraged to call [facility] for any additional concerns. The patient was referred to [facility] on August 18, 2017. Recommendation for bilateral breast MRI due to extremely dense breast tissue. Pathology results reported by Terie Purser, RN on 08/12/2017. Electronically Signed   By: Dorise Bullion III M.D   On: 08/12/2017 15:00   Result Date: 08/13/2017 CLINICAL DATA:  Right breast mass biopsy EXAM: ULTRASOUND GUIDED RIGHT BREAST CORE NEEDLE BIOPSY COMPARISON:  Previous exam(s). FINDINGS: I met with the patient and we discussed the procedure of ultrasound-guided biopsy, including benefits and alternatives. We discussed the high likelihood of a successful procedure. We discussed the risks of the procedure, including infection, bleeding, tissue injury, clip migration, and inadequate sampling. Informed written consent was given. The usual time-out protocol was performed immediately prior to the procedure. Lesion quadrant: Upper-outer Using sterile technique  and 1% Lidocaine as local anesthetic, under direct ultrasound visualization, a 12 gauge spring-loaded device was used to perform biopsy of a right breast mass using a lateral approach. At the conclusion of the procedure a tissue marker clip was deployed into the biopsy cavity. Follow up 2 view mammogram was performed and dictated separately. IMPRESSION: Ultrasound guided biopsy of a right breast mass. No apparent complications. Electronically Signed: By: Dorise Bullion III M.D On: 08/11/2017 15:51   [place] Rt Breast Bx W Loc Dev Ea Add Lesion Img Bx Spec  US Guide  Addendum Date: 08/13/2017   ADDENDUM REPORT: 08/12/2017 15:00 ADDENDUM: Pathology revealed GRADE II INVASIVE AND IN SITU MAMMARY CARCINOMA of the Right breast, 11:00 o'clock. FIBROCYSTIC CHANGES of the Right breast, 10:00 o'clock lymph node. This was found to be concordant by Dr. Dorise Bullion. Pathology results were discussed with the patient by telephone. The patient reported doing well after the biopsies with tenderness at the sites. Post biopsy instructions and care were reviewed and questions were answered. The patient was encouraged to call [facility] for any additional concerns. The patient was referred to [facility] on August 18, 2017. Recommendation for bilateral breast MRI due to extremely dense breast tissue. Pathology results reported by Terie Purser, RN on 08/12/2017. Electronically Signed   By: Dorise Bullion III M.D   On: 08/12/2017 15:00   Result Date: 08/13/2017 CLINICAL DATA:  Biopsy of axillary tail lymph node. EXAM: ULTRASOUND GUIDED RIGHT BREAST CORE NEEDLE BIOPSY COMPARISON:  Previous exam(s). FINDINGS: I met with the patient and we discussed the procedure of ultrasound-guided biopsy, including benefits and alternatives. We discussed the high likelihood of a successful procedure. We discussed the risks of the procedure,  including infection, bleeding, tissue injury, clip migration, and inadequate sampling. Informed written consent was given. The usual time-out protocol was performed immediately prior to the procedure. Lesion quadrant: Upper-outer Using sterile technique and 1% Lidocaine as local anesthetic, under direct ultrasound visualization, a 14 gauge spring-loaded device was used to perform biopsy of a lymph node in the right axillary tail using a lateral approach. At the conclusion of the procedure a HydroMARK tissue marker clip was deployed into the biopsy cavity. Follow up 2 view mammogram was performed and dictated separately. IMPRESSION: Ultrasound guided biopsy of an axillary tail lymph node. No apparent complications. Electronically Signed: By: Dorise Bullion III M.D On: 08/11/2017 15:52    ASSESSMENT & PLAN:  Dawn Ramos is a 41 y.o. female with a history of ADHD and Bipolar 1 disorders.    1. Malignant neoplasm of upper-outer quadrant of right breast, invasive lobular carcinoma, cT2, N0, M0, Stage IB, ER100%+, PR100%+, HER2: Negative, Grade 2, (+) LCIS  -We discussed her imaging findings and the biopsy results in great details. -Giving the early stage disease, she is likely a candidate for lumpectomy, target lymph node dissection and sentinel lymph node biops. She is agreeable with that. She was seen by Dr. Barry Dienes today and likely will proceed with surgery soon.  -Due to her early age, she will be referred to genetic counseling, to ruled out inheritable breast cancer syndrome. -She is clinically doing very well, asymptomatic, lab reviewed, unremarkable, no clinical suspicion for metastatic disease, I do not think she needs staging scans.  -I recommend Oncotype test on the surgical sample and we'll make a decision about adjuvant chemotherapy based on the Oncotype result. Written material of this test was given to her. She is young and fit, would be a good candidate for chemotherapy if her Oncotype  recurrence score is high. -If she has positive lymph node on surgical pathology, I would send mammaprint test -Giving the strong ER and PR positivity, low Ki-67, lobular histology, this is likely luminal type A, low risk disease. -Giving the strong ER and PR expression in her premenopausal status, I recommend adjuvant endocrine therapy with Tamoxifen for a total of 10 years to reduce the risk of cancer recurrence. Potential benefits and side effects  were discussed with patient and she is interested. -Given her young age, I also recommend her to consider offering suppression and aromatase inhibitor as adjuvant endocrine therapy, which will likely further decrease her risk of recurrence, comparing to tamoxifen.  -She was also seen by radiation oncologist Dr. Lisbeth Renshaw today. If she undergo lumpectomy or has positive lymph nodes on surgery, she will need adjuvant radiation.  -We also discussed the breast cancer surveillance after her surgery. She will continue annual screening mammogram, self exam, and a routine office visit with lab and exam with [place]. -I encouraged her to have healthy diet and exercise regularly.    2. Genetic Testing -Due to her young age I suggest genetic testing to see if she has any gene mutations. She agreed. -I will refer her to genetics   3. Smoking cessation -She stopped smoking 2 days ago but would like to stop completely. Her husband is also a smoker. She had smoked for 19 years, half a pack daily.  -I suggest she see her PCP or Psychiatrist for medication if needed  -I will also refer her to [facility] cessation program.   4. Bipolar  -Stable. We'll continue medication. She'll follow-up with her psychiatrist  PLAN:  -Surgery with Dr. Barry Dienes -Oncotype or Mammaprint on her surgical sample  -f/u after radiation or sooner if mammaprint shows high risk.    No orders of the defined types were placed in this encounter.   All questions were answered. The patient knows  to call the clinic with any problems, questions or concerns. I spent 55 minutes counseling the patient face to face. The total time spent in the appointment was 60 minutes and more than 50% was on counseling.  This document serves as a record of services personally performed by Truitt Merle, MD. It was created on her behalf by Joslyn Devon, a trained medical scribe. The creation of this record is based on the scribe's personal observations and the provider's statements to them. This document has been checked and approved by the attending provider.      Truitt Merle, MD 08/18/2017

## 2017-08-19 ENCOUNTER — Ambulatory Visit (HOSPITAL_BASED_OUTPATIENT_CLINIC_OR_DEPARTMENT_OTHER): Payer: BLUE CROSS/BLUE SHIELD | Admitting: Genetics

## 2017-08-19 ENCOUNTER — Telehealth: Payer: Self-pay

## 2017-08-19 ENCOUNTER — Encounter: Payer: Self-pay | Admitting: Genetics

## 2017-08-19 ENCOUNTER — Other Ambulatory Visit: Payer: BLUE CROSS/BLUE SHIELD

## 2017-08-19 DIAGNOSIS — Z7183 Encounter for nonprocreative genetic counseling: Secondary | ICD-10-CM

## 2017-08-19 DIAGNOSIS — Z170 Estrogen receptor positive status [ER+]: Secondary | ICD-10-CM | POA: Diagnosis not present

## 2017-08-19 DIAGNOSIS — C50911 Malignant neoplasm of unspecified site of right female breast: Secondary | ICD-10-CM | POA: Diagnosis not present

## 2017-08-19 NOTE — Progress Notes (Signed)
REFERRING PROVIDER: Truitt Merle, MD [place], [place] [address]  PRIMARY PROVIDER:  System, Pcp Not In  PRIMARY REASON FOR VISIT:  1. Malignant neoplasm of right breast in female, estrogen receptor positive, unspecified site of breast ([place])      HISTORY OF PRESENT ILLNESS:   [name], a 41 y.o. female, was seen for a [facility] cancer genetics consultation at the request of Dr. Burr Medico due to a personal history of cancer.  [name] presents to clinic today to discuss the possibility of a hereditary predisposition to cancer, genetic testing, and to further clarify her future cancer risks, as well as potential cancer risks for family members. She was accompanied to her appointment by her husband.  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 Her treatment is pending. She plans lumpectomy, but states that the results of her genetic testing may influence this decision.   CANCER HISTORY:  Oncology History   Cancer Staging Malignant neoplasm of upper-outer quadrant of right breast in female, estrogen receptor positive ([place]) Staging form: Breast, AJCC 8th Edition - Clinical stage from 08/11/2017: Stage IB (cT2, cN0, cM0, G2, ER: Positive, PR: Positive, HER2: Negative) - Signed by Truitt Merle, MD on 08/17/2017       Malignant neoplasm of upper-outer quadrant of right breast in female, estrogen receptor positive ([place])   08/06/2017 Mammogram    IMPRESSION: 1. Palpable 2.1 cm mass in the 11 o'clock retroareolar right breast is highly suspicious for malignancy. 2. 1.3 cm markedly hypoechoic lymph node in the 10 o'clock position of the right breast axillary tail is indeterminate. Involvement with metastatic carcinoma cannot be excluded. 3. No suspicious lymph nodes are identified within the right axilla. 4. Extremely dense breast parenchyma. If breast biopsy is positive for malignancy, breast MRI  is suggested. 5. No evidence of malignancy in the left breast.       08/11/2017 Receptors her2    Estrogen Receptor: 100%, POSITIVE, STRONG STAINING INTENSITY Progesterone Receptor: 100%, POSITIVE, STRONG STAINING INTENSITY Proliferation Marker Ki67: 10% HER2 NEGATIVE       08/11/2017 Initial Biopsy    Diagnosis 1. Breast, right, needle core biopsy, 11:00 o'clock - INVASIVE AND IN SITU LOBULAR CARCINOMA. - SEE COMMENT. 2. Breast, right, needle core biopsy, 10:00 o'clock lymph node - FIBROCYSTIC CHANGES. - LYMPH NODAL TISSUE IS NOT IDENTIFIED. - THERE IS NO EVIDENCE OF MALIGNANCY.      08/17/2017 Initial Diagnosis    Malignant neoplasm of upper-outer quadrant of right breast in female, estrogen receptor positive ([place])      Past Medical History:  Diagnosis Date  . ADHD   . Bipolar 1 disorder ([place])   . Depression     Past Surgical History:  Procedure Laterality Date  . WRIST SURGERY Left     Social History   Social History  . Marital status: Married    Spouse name: N/A  . Number of children: N/A  . Years of education: N/A   Social History Main Topics  . Smoking status: Current Every Day Smoker    Packs/day: 0.50    Years: 17.00    Types: Cigarettes  . Smokeless tobacco: Current User  . Alcohol use No  . Drug use: Yes    Types: Other-see comments     Comment: socially/occas/ shrooms  . Sexual activity: Not on file   Other Topics Concern  . Not on file  Social History Narrative  . No narrative on file     FAMILY HISTORY:  We obtained a detailed, 4-generation family history.  Significant diagnoses are listed below: Family History  Problem Relation Age of Onset  . Lymphoma Paternal Uncle 64  . Melanoma Paternal Uncle 98   Dawn Ramos has no children. She has a brother, age 75, who is without cancers. Dawn Ramos parents are both living, at age 79, and are without cancers. Dawn Ramos mother is an only child. Dawn Ramos maternal grandparents  died in their 63s and 46s without cancers. Dawn Ramos has two paternal uncles. One uncle had melanoma at age 53 and non-Hodgkins lymphoma at 87. He is now doing well in his late-70s. The other uncle is in his early-60s without cancers. Dawn Ramos paternal grandparents died in their 91s and 11s without cancers.  Dawn Ramos is unaware of previous family history of genetic testing for hereditary cancer risks. Patient's maternal ancestors are of Italian/Irish descent, and paternal ancestors are of British/Swedish descent. There is no reported Ashkenazi Jewish ancestry. There is no known consanguinity.  GENETIC COUNSELING ASSESSMENT: Dawn Ramos is a 41 y.o. female with a recent diagnosis of breast cancer which is somewhat suggestive of a hereditary cancer syndrome and predisposition to cancer. We, therefore, discussed and recommended the following at today's visit.   DISCUSSION: We reviewed the characteristics, features and inheritance patterns of hereditary cancer syndromes. We also discussed genetic testing, including the appropriate family members to test, the process of testing, insurance coverage and turn-around-time for results. We discussed the implications of a negative, positive and/or variant of uncertain significant result. In order to get genetic test results in a timely manner so that Dawn Ramos can use these genetic test results for surgical decisions, we recommended Dawn Ramos pursue genetic testing for the 9-gene High/Moderate Breast Risk STAT Panel offered by [facility]. If this test is negative, we then recommend Dawn Ramos pursue reflex genetic testing to a custom panel that combines [facility]'s Common Hereditary Cancer Panel and [facility]'s Melanoma Panel. This panel analyzed the following 53 genes: APC, ATM, AXIN2, BARD1, BMPR1A, BRCA1, BRCA2, BRIP1, CDH1, CDKN2A, CHEK2, CTNNA1, DICER1, EPCAM, GREM1, HOXB13, KIT, MEN1, MLH1, MSH2, MSH3, MSH6, MUTYH, NBN, NF1, NTHL1, PALB2, PDGFRA, PMS2,  POLD1, POLE, PTEN, RAD50, RAD51C, RAD51D, SDHA, SDHB, SDHC, SDHD, SMAD4, SMARCA4, STK11, TP53, TSC1, TSC2, VHL.   Based on Dawn Ramos's personal history of cancer, she meets medical criteria for genetic testing. Despite that she meets criteria, she may still have an out of pocket cost. We discussed that if her out of pocket cost for testing is over $100, the laboratory will call and confirm whether she wants to proceed with testing.  If the out of pocket cost of testing is less than $100 she will be billed by the genetic testing laboratory.   PLAN: After considering the risks, benefits, and limitations, Ms. Milbrath  provided informed consent to pursue genetic testing and the blood sample was sent to [facility] for analysis of the 9-gene High/Moderate Breast Risk STAT Panel. Results should be available within approximately 2 weeks' time, at which point they will be disclosed by telephone to Ms. Tellez, as will any additional recommendations warranted by these results. This information will also be available in Epic.   Lastly, we encouraged Ms. Lichtman to remain in contact with cancer genetics annually so that we can continuously update the family history and inform her of any changes in cancer genetics and testing that may be  of benefit for this family.   Ms.  Castello questions were answered to her satisfaction today. Our contact information was provided should additional questions or concerns arise. Thank you for the referral and allowing [place] to share in the care of your patient.   Mal Misty, MS, [facility] Certified Naval [email or web address] phone: [phone]  The patient was seen for a total of 25 minutes in face-to-face genetic counseling.  This patient was discussed with Drs. Magrinat, Lindi Adie and/or Burr Medico who agrees with the above.    _______________________________________________________________________ For Office Staff:  Number of people involved in session:  2 Was an Intern/ student involved with case: no

## 2017-08-19 NOTE — Telephone Encounter (Signed)
No orders per los 8/29

## 2017-08-25 ENCOUNTER — Telehealth: Payer: Self-pay | Admitting: *Deleted

## 2017-08-25 NOTE — Telephone Encounter (Signed)
  Oncology Nurse Navigator Documentation  Navigator Location: [facility] (08/25/17 1000)   )Navigator Encounter Type: Telephone (08/25/17 1000) Telephone: Outgoing Call;Clinic/MDC Follow-up (08/25/17 1000)                                                  Time Spent with Patient: 15 (08/25/17 1000)

## 2017-08-26 ENCOUNTER — Ambulatory Visit (HOSPITAL_COMMUNITY)
Admission: RE | Admit: 2017-08-26 | Discharge: 2017-08-26 | Disposition: A | Discharge status: Home / Self Care | Payer: BLUE CROSS/BLUE SHIELD | Source: Ambulatory Visit | Attending: General Surgery | Admitting: General Surgery

## 2017-08-26 DIAGNOSIS — N6320 Unspecified lump in the left breast, unspecified quadrant: Secondary | ICD-10-CM | POA: Insufficient documentation

## 2017-08-26 DIAGNOSIS — Z170 Estrogen receptor positive status [ER+]: Secondary | ICD-10-CM | POA: Insufficient documentation

## 2017-08-26 DIAGNOSIS — C50411 Malignant neoplasm of upper-outer quadrant of right female breast: Secondary | ICD-10-CM | POA: Insufficient documentation

## 2017-08-26 MED ORDER — GADOBENATE DIMEGLUMINE 529 MG/ML IV SOLN
15.0000 mL | Freq: Once | INTRAVENOUS | Status: AC | PRN
  Administered 2017-08-26: 11 mL via INTRAVENOUS

## 2017-08-26 NOTE — Progress Notes (Signed)
Please let pt know we need to get an MRI bx of the right breast in a different spot.  I told her when we ordered it that this may be a possibility.

## 2017-08-30 ENCOUNTER — Telehealth: Payer: Self-pay | Admitting: Genetics

## 2017-08-30 ENCOUNTER — Ambulatory Visit: Payer: Self-pay | Admitting: Genetics

## 2017-08-30 DIAGNOSIS — Z1502 Genetic susceptibility to malignant neoplasm of ovary: Secondary | ICD-10-CM

## 2017-08-30 DIAGNOSIS — Z1379 Encounter for other screening for genetic and chromosomal anomalies: Secondary | ICD-10-CM | POA: Insufficient documentation

## 2017-08-30 DIAGNOSIS — Z1509 Genetic susceptibility to other malignant neoplasm: Secondary | ICD-10-CM

## 2017-08-30 DIAGNOSIS — Z1501 Genetic susceptibility to malignant neoplasm of breast: Secondary | ICD-10-CM

## 2017-08-30 DIAGNOSIS — Z1589 Genetic susceptibility to other disease: Secondary | ICD-10-CM

## 2017-08-30 NOTE — Telephone Encounter (Signed)
Reviewed that Ms. 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 Discussed associated risks and management recommendations. Recommend testing for family members. Ms. Tokarczyk declined a return appointment to discuss these results in person. Please see encounter notes from 08/30/2017 for further discussion and a copy of the report. Result report is dated 08/29/2017. Additional results from a larger hereditary cancer panel are pending. We will call Ms. Ziolkowski with these results once available.

## 2017-08-30 NOTE — Progress Notes (Signed)
HPI: Ms. Cinelli was previously seen in the [facility] clinic on 08/19/2017 due to a recent diagnosis of breast cancer at age 41. Please refer to our prior cancer genetics clinic note for more information regarding Ms. Amyx's medical, social and family histories, and our assessment and recommendations, at the time. Ms. Rider recent genetic test results were disclosed to her, as were recommendations warranted by these results. These results and recommendations are discussed in more detail below.  CANCER HISTORY: In August 2018, at the age of 57, Ms. 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 Her treatment is pending.   Oncology History   Cancer Staging Malignant neoplasm of upper-outer quadrant of right breast in female, estrogen receptor positive ([place]) Staging form: Breast, AJCC 8th Edition - Clinical stage from 08/11/2017: Stage IB (cT2, cN0, cM0, G2, ER: Positive, PR: Positive, HER2: Negative) - Signed by Truitt Merle, MD on 08/17/2017       Malignant neoplasm of upper-outer quadrant of right breast in female, estrogen receptor positive ([place])   08/06/2017 Mammogram    IMPRESSION: 1. Palpable 2.1 cm mass in the 11 o'clock retroareolar right breast is highly suspicious for malignancy. 2. 1.3 cm markedly hypoechoic lymph node in the 10 o'clock position of the right breast axillary tail is indeterminate. Involvement with metastatic carcinoma cannot be excluded. 3. No suspicious lymph nodes are identified within the right axilla. 4. Extremely dense breast parenchyma. If breast biopsy is positive for malignancy, breast MRI is suggested. 5. No evidence of malignancy in the left breast.       08/11/2017 Receptors her2    Estrogen Receptor: 100%, POSITIVE, STRONG STAINING INTENSITY Progesterone Receptor: 100%, POSITIVE, STRONG STAINING INTENSITY Proliferation Marker Ki67: 10% HER2 NEGATIVE       08/11/2017 Initial Biopsy   Diagnosis 1. Breast, right, needle core biopsy, 11:00 o'clock - INVASIVE AND IN SITU LOBULAR CARCINOMA. - SEE COMMENT. 2. Breast, right, needle core biopsy, 10:00 o'clock lymph node - FIBROCYSTIC CHANGES. - LYMPH NODAL TISSUE IS NOT IDENTIFIED. - THERE IS NO EVIDENCE OF MALIGNANCY.      08/17/2017 Initial Diagnosis    Malignant neoplasm of upper-outer quadrant of right breast in female, estrogen receptor positive ([place])     08/29/2017 Genetic Testing    Ms. Putzier underwent genetic counseling and testing for hereditary cancer syndromes on 08/19/2017. 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         FAMILY HISTORY:  We obtained a detailed, 4-generation family history.  Significant diagnoses are listed below: Family History  Problem Relation Age of Onset  . Lymphoma Paternal Uncle 11  . Melanoma Paternal Uncle 45   Ms. Burston has no children. She has a brother, age 48, who is without cancers. Ms. Polack parents are both living, at age 23, and are without cancers. Ms. Bremer mother is an only child. Ms. Mayon maternal grandparents died in their 27s and 1s without cancers. Ms. Dillehay has two paternal uncles. One uncle had melanoma at age 28 and non-Hodgkins lymphoma at 66. He is now doing well in his late-70s. The other uncle is in his early-60s without cancers. Ms. Zwack paternal grandparents died in their 30s and 75s without cancers.  Ms. Heney is unaware of previous family history of genetic testing for hereditary cancer risks. Patient's maternal ancestors are of Italian/Irish descent, and paternal ancestors are of British/Swedish descent. There is no reported Ashkenazi Jewish ancestry. There is no known  consanguinity.  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 No other mutations or variants of uncertain significance were identified for the other 8 genes included on this panel. Results on an additional 44 genes through a custom panel are pending and will be reported to Ms. Earleen Reaper via telephone once available.  The test report will be scanned into EPIC and will be located under the Molecular Pathology section of the Results Review tab.A portion of the result report is included below for reference.     DISCUSSION: CHEK2 mutations have been found to be associated with an increased risk of breast and other cancers. The estimated cancer risks vary widely and may be influenced by family history. Women with a CHEK2 deleterious mutation have approximately a 24% (no family history of breast cancer) to 48% (strong family history of breast cancer) lifetime risk of breast cancer and up to a 25% risk of a second breast cancer. Men may have an increased risk for female breast cancer of about 1%. Men and women may have an increased risk of colon cancer (~10% lifetime risk). According to the NCCN guidelines, individuals with CHEK2 mutations should consider breast MRI's as a part of regular breast cancer screening, and depending on family history could consider a risk-reducing mastectomy. For women, breast cancer screening should begin at age 37 or 12 years younger than the earliest age at breast cancer diagnosis in the family. Colon cancer screening should begin at age 52 and continue every 5 years or based on polyp number.     CANCER SCREENING: Current screening guidelines from the [employer]  (NCCN) for those with a pathogenic CHEK2 variant are as follows:  Breast cancer:  Annual mammogram with consideration of tomosynthesis; also consider breast MRI with contrast beginning at age 36  Evidence of risk-reducing mastectomy is insufficient; manage based on family history  Colon cancer:  Colonoscopy screening every 5  years beginning at age 66  If an individual has a first-degree relative with colorectal cancer, screening should begin 10 years prior to the relative's age at diagnosis if before 65.  If an individual has a personal history of colorectal cancer, screening recommendations should be based on recommendations for post-colorectal cancer resection.  Additional publications underscore the appropriateness of breast MRIs and consideration of chemoprevention (tamoxifen) due to the greater than 20% lifetime risk of breast cancer associated with the 1100delC variant (PMID: 45997741, 42395320).  It has been suggested that men with a CHEK2 pathogenic variant and a first-degree relative with prostate cancer have an annual prostate-specific antigen (PSA) analysis (PMID: 23343568). However, the benefits of screening for prostate cancer among men with a pathogenic variant in CHEK2 are uncertain (PMID: 61683729).  FAMILY MEMBERS: It is important that all of Ms. Norfolk's relatives (both men and women) know of the presence of this gene mutation.  Women need to know that they may be at increased risk for breast and colon cancers.  Men are at increased risk for colon cancer.  Genetic testing can sort out who in the family is at risk and who is not. We would be happy to help meet with and coordinate genetic testing for any relative that is interested.  Should Ms. Spoto have children in the future, each child would have a 50% risk to have inherited the mutation found in her. If Ms. Lindahland herpartner would like to explore  their reproductive options such as in vitro fertilization (IVF) with preimplantation genetic diagnosis (PGD), they are recommended to consult a reproductive genetic counselor prior to starting a family. If Ms. Lindahland herpartner are not interested in these options, we recommend their children have genetic testing for this same mutation, between the ages of 56 and 56, as identifying the presence of  this mutation would allow them to also take advantage of risk-reducing measures.  Ms. Christine parents and brother each have a 50% risk to have the CHEK2 mutation found in her. We recommend they have genetic testing for this same mutation, as identifying the presence of this mutation would allow them to also take advantage of risk-reducing measures. Genetic testing is recommended for Ms. Shatto's parents to inform their own cancer risks, but also determine which side of the family (maternal or paternal) is at increased risk for CHEK2-related cancers. Until it can be determined which side of the family carries this CHEK2 mutation, Ms. Aldaz is encouraged to inform individuals on both sides of her family of this mutation.  PLAN:  1. Ms. Norgren declines a return genetic counseling appointment at this time. Per her request, her results and genetic counseling notes will be emailed to [email or web address]. Our knowledge of cancer risks related to CHEK2 mutations will continue to evolve. We recommend that Ms. Schexnider follow up with the genetics clinic annually so we can provide her with the most current information about CHEK2 and cancer risk, as well as with any changes to her family history (new cancer diagnoses, genetic test results). 2. Genetic testing results will be routed to Dr. Burr Medico and Dr. Barry Dienes to inform her breast cancer treatment plan and on-going CHEK2-related risk management. 3. Our contact number was provided. Ms. Allaire questions were answered to her satisfaction, and she knows she is welcome to call us at anytime with additional questions or concerns.   4. Pending results for a remaining 44 genes analyzed by Ms. Sanson's genetic testing will be reported to Ms. Earleen Reaper via telephone once available.  SUPPORT AND RESOURCES:  We provided information about two support groups for hereditary cancer syndrome information and support, Facing Our Risk (www.facingourrisk.com) and Bright Pink  (www.brightpink.org) which some people have found useful.  They provide opportunities to speak with other individuals from high-risk families.     Mal Misty, MS, [facility] Certified Naval [email or web address]

## 2017-08-31 ENCOUNTER — Other Ambulatory Visit: Payer: Self-pay | Admitting: General Surgery

## 2017-08-31 DIAGNOSIS — N63 Unspecified lump in unspecified breast: Secondary | ICD-10-CM

## 2017-09-06 ENCOUNTER — Telehealth: Payer: Self-pay | Admitting: Genetic Counselor

## 2017-09-06 ENCOUNTER — Ambulatory Visit: Payer: Self-pay | Admitting: Genetic Counselor

## 2017-09-06 ENCOUNTER — Other Ambulatory Visit: Payer: BLUE CROSS/BLUE SHIELD

## 2017-09-06 DIAGNOSIS — Z1501 Genetic susceptibility to malignant neoplasm of breast: Secondary | ICD-10-CM

## 2017-09-06 DIAGNOSIS — Z1589 Genetic susceptibility to other disease: Secondary | ICD-10-CM

## 2017-09-06 DIAGNOSIS — C50411 Malignant neoplasm of upper-outer quadrant of right female breast: Secondary | ICD-10-CM

## 2017-09-06 DIAGNOSIS — Z1509 Genetic susceptibility to other malignant neoplasm: Secondary | ICD-10-CM

## 2017-09-06 DIAGNOSIS — Z1502 Genetic susceptibility to malignant neoplasm of ovary: Secondary | ICD-10-CM

## 2017-09-06 DIAGNOSIS — Z170 Estrogen receptor positive status [ER+]: Secondary | ICD-10-CM

## 2017-09-06 DIAGNOSIS — Z1379 Encounter for other screening for genetic and chromosomal anomalies: Secondary | ICD-10-CM

## 2017-09-06 NOTE — Telephone Encounter (Signed)
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  She reviewed the information she discussed with Vicente Males, and has decided to do a bilateral mastectomy.  I have released copies of her test results to her via [facility] portal.

## 2017-09-06 NOTE — Progress Notes (Signed)
Genetic testing did detect a Variant of Unknown Significance in the [facility] gene called c.803_8011del 9p.Val268_Pro270del).  At this time, it is unknown if this variant is associated with increased cancer risk or if this is a normal finding, but most variants such as this get reclassified to being inconsequential. It should not be used to make medical management decisions. With time, we suspect the lab will determine the significance of this variant, if any. If we do learn more about it, we will try to contact Ms. Nienaber to discuss it further. However, it is important to stay in touch with [place] periodically and keep the address and phone number up to date.

## 2017-09-15 ENCOUNTER — Other Ambulatory Visit: Payer: Self-pay | Admitting: General Surgery

## 2017-09-15 DIAGNOSIS — C50411 Malignant neoplasm of upper-outer quadrant of right female breast: Secondary | ICD-10-CM

## 2017-09-21 ENCOUNTER — Other Ambulatory Visit: Payer: Self-pay | Admitting: General Surgery

## 2017-09-21 DIAGNOSIS — C50411 Malignant neoplasm of upper-outer quadrant of right female breast: Secondary | ICD-10-CM

## 2017-09-21 DIAGNOSIS — Z170 Estrogen receptor positive status [ER+]: Principal | ICD-10-CM

## 2017-09-22 ENCOUNTER — Telehealth: Payer: Self-pay | Admitting: Genetics

## 2017-09-22 NOTE — Telephone Encounter (Signed)
Patient left message stating her full name and that she gives permission to discuss her genetic test results and genetic counseling appointment information with her parents Shauntel Prest and Markan Cazarez.

## 2017-09-24 NOTE — H&P (Signed)
  Subjective:     Patient ID: Dawn Ramos is a 41 y.o. female.  HPI Here for follow up discussion breast reconstruction. Presented with palpable mass. This was first MMG. MMG with distortion UOQ right 2 cm. [place] with mass 11 o clock 2.1 x 1.5 x 1.1 cm, deep margin of the mass adjacent to the pectoralis muscle. A LN 1.3 cm imaged in the axillary tail of the right breast at 10 o'clock position; [place] axilla negative.  Biopsy of mass with ILC, ER/PR+, Her2 -. Biopsy of 10 o clock LN with fibrocystic changes, no lymph tissue.  MRI with known 2.1 cm mass UOQ and NME involving the upper inner quadrant. MR Biopsy of UIQ ordered- patient states she would like to pursue bilateral mastectomies and this was not completed.  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   Current 34C, happy with this. Accompanied by both parents, husband.  Prior 0.5 ppd- states last cig was her consult visit date. Notes husband has also quit.  PMH significant for bipolar d/o.  Lives with husband. Works for [employer]. Avid biker.  Review of Systems     Objective:   Physical Exam  Cardiovascular: Normal rate, regular rhythm and normal heart sounds.   Pulmonary/Chest: Effort normal and breath sounds normal.  Abdominal:  No redundant tissue  Lymphadenopathy:    She has no axillary adenopathy.  Skin:  Fitzpatrick 2   No ptosis, left >right volume Palpable mass right UOQ with arm raised SN to nipple R 18 L 18 cm BW R 15 L 15 (CW 12 cm) Nipple to IMF R 7 L 8 cm     Assessment:     Right breast ca UOQ ER+ Chek2 mutation    Plan:      Plan bilateral NSM with immediate expander, acellular dermis reconstruction. Reviewed incisions, drains, OR length, hospital stay and post procedure limitation. Discussed process of expansion and implant based risks including rupture, infection requiring surgery or removal, contracture. Discussed use of acellular dermis in reconstruction,  cadaveric source, incorporation over several weeks, risk that if has seroma or infection can act as additional nidus for infection if not incorporated   Discussed future surgery dependent on adjuvant treatments.  Discussed prepectoral vs sub pectoral reconstruction. Discussed with patient and benefit of this is no animation deformity, may be less pain. Risk may be more visible rippling over upper poles, greater need of ADM. Reviewed pre pectoral would require larger amount acellular dermis, more drains. Discussed any type reconstruction also risks long term displacement implant and visible rippling. If prepectoral counseled I would recommend she be comfortable with silicone implants as more options that have less rippling. She agrees to prepectoral placement.  Reviewed reconstruction will be asensate and not stimulate. Reviewed additional risks including but not limited to risks mastectomy flap necrosis requiring additional surgery, seroma, hematoma, asymmetry, need to additional procedures, fat necrosis, DVT/PE, damage to adjacent structures, cardiopulmonary complications.  Irene Limbo, MD [facility] [phone], pin [phone]

## 2017-09-29 NOTE — Pre-Procedure Instructions (Signed)
Dawn Ramos  09/29/2017      [id number] [address], [place] [address] Phone: [phone] Fax: [phone]    Your procedure is scheduled on 10/07/2017.  Report to [facility] Admitting at 0730 A.M.  Call this number if you have problems the morning of surgery:  [phone]   Remember:  Do not eat food or drink liquids after midnight.  Continue all other medications as directed by your physician except follow these medication instructions before surgery   Take these medicines the morning of surgery with A SIP OF WATER: NONE  7 days prior to surgery STOP taking any Aspirin (unless otherwise instructed by your surgeon), Aleve, Naproxen, Ibuprofen, Motrin, Advil, Goody's, BC's, all herbal medications, fish oil, and all vitamins    Do not wear jewelry, make-up or nail polish.  Do not wear lotions, powders, or perfumes, or deodorant.  Do not shave 48 hours prior to surgery.  Do not bring valuables to the hospital.  [facility] is not responsible for any belongings or valuables.  Contacts, eyeglasses, dentures or bridgework may not be worn into surgery.  Leave your suitcase in the car.  After surgery it may be brought to your room.  For patients admitted to the hospital, discharge time will be determined by your treatment team.  Patients discharged the day of surgery will not be allowed to drive home.   Name and phone number of your driver:   Special instructions:   [facility]- Preparing For Surgery  Before surgery, you can play an important role. Because skin is not sterile, your skin needs to be as free of germs as possible. You can reduce the number of germs on your skin by washing with CHG (chlorahexidine gluconate) Soap before surgery.  CHG is an antiseptic cleaner which kills germs and bonds with the skin to continue killing germs even after  washing.  Please do not use if you have an allergy to CHG or antibacterial soaps. If your skin becomes reddened/irritated stop using the CHG.  Do not shave (including legs and underarms) for at least 48 hours prior to first CHG shower. It is OK to shave your face.  Please follow these instructions carefully.   1. Shower the NIGHT BEFORE SURGERY and the MORNING OF SURGERY with CHG.   2. If you chose to wash your hair, wash your hair first as usual with your normal shampoo.  3. After you shampoo, rinse your hair and body thoroughly to remove the shampoo.  4. Use CHG as you would any other liquid soap. You can apply CHG directly to the skin and wash gently with a scrungie or a clean washcloth.   5. Apply the CHG Soap to your body ONLY FROM THE NECK DOWN.  Do not use on open wounds or open sores. Avoid contact with your eyes, ears, mouth and genitals (private parts). Wash genitals (private parts) with your normal soap.  6. Wash thoroughly, paying special attention to the area where your surgery will be performed.  7. Thoroughly rinse your body with warm water from the neck down.  8. DO NOT shower/wash with your normal soap after using and rinsing off the CHG Soap.  9. Pat yourself dry with a CLEAN TOWEL.  10. Wear CLEAN PAJAMAS to bed the night before surgery, wear comfortable clothes the morning of surgery  11. Place CLEAN SHEETS on your  bed the night of your first shower and DO NOT SLEEP WITH PETS.    Day of Surgery: Shower as stated above. Do not apply any deodorants/lotions.  Please wear clean clothes to the hospital/surgery center.      Please read over the following fact sheets that you were given.

## 2017-09-30 ENCOUNTER — Encounter (HOSPITAL_COMMUNITY): Payer: Self-pay

## 2017-09-30 ENCOUNTER — Encounter (HOSPITAL_COMMUNITY)
Admission: RE | Admit: 2017-09-30 | Discharge: 2017-09-30 | Disposition: A | Discharge status: Home / Self Care | Payer: BLUE CROSS/BLUE SHIELD | Source: Ambulatory Visit | Attending: General Surgery | Admitting: General Surgery

## 2017-09-30 DIAGNOSIS — C50411 Malignant neoplasm of upper-outer quadrant of right female breast: Secondary | ICD-10-CM | POA: Diagnosis not present

## 2017-09-30 DIAGNOSIS — Z170 Estrogen receptor positive status [ER+]: Secondary | ICD-10-CM | POA: Insufficient documentation

## 2017-09-30 DIAGNOSIS — Z01812 Encounter for preprocedural laboratory examination: Secondary | ICD-10-CM | POA: Insufficient documentation

## 2017-09-30 LAB — COMPREHENSIVE METABOLIC PANEL
ALK PHOS: 47 U/L (ref 38–126)
ALT: 18 U/L (ref 14–54)
AST: 24 U/L (ref 15–41)
Albumin: 4.5 g/dL (ref 3.5–5.0)
Anion gap: 10 (ref 5–15)
BUN: 18 mg/dL (ref 6–20)
CALCIUM: 9.4 mg/dL (ref 8.9–10.3)
CO2: 27 mmol/L (ref 22–32)
Chloride: 100 mmol/L — ABNORMAL LOW (ref 101–111)
Creatinine, Ser: 0.74 mg/dL (ref 0.44–1.00)
GFR calc Af Amer: >60 mL/min (ref >60)
GFR calc non Af Amer: >60 mL/min (ref >60)
GLUCOSE: 96 mg/dL (ref 65–99)
Potassium: 4.2 mmol/L (ref 3.5–5.1)
SODIUM: 137 mmol/L (ref 135–145)
TOTAL PROTEIN: 6.7 g/dL (ref 6.5–8.1)
Total Bilirubin: 0.7 mg/dL (ref 0.3–1.2)

## 2017-09-30 LAB — CBC
HCT: 40.1 % (ref 36.0–46.0)
Hemoglobin: 13.5 g/dL (ref 12.0–15.0)
MCH: 30.8 pg (ref 26.0–34.0)
MCHC: 33.7 g/dL (ref 30.0–36.0)
MCV: 91.3 fL (ref 78.0–100.0)
PLATELETS: 222 10*3/uL (ref 150–400)
RBC: 4.39 MIL/uL (ref 3.87–5.11)
RDW: 12.2 % (ref 11.5–15.5)
WBC: 5.7 10*3/uL (ref 4.0–10.5)

## 2017-09-30 LAB — HCG, SERUM, QUALITATIVE: Preg, Serum: NEGATIVE

## 2017-09-30 NOTE — Progress Notes (Signed)
PCP - denies Cardiologist -denies   Chest x-ray - not needed EKG - not needed Stress Test - denies ECHO - denies Cardiac Cath - denies     Patient denies shortness of breath, fever, cough and chest pain at PAT appointment   Patient verbalized understanding of instructions that were given to them at the PAT appointment. Patient was also instructed that they will need to review over the PAT instructions again at home before surgery.

## 2017-10-04 NOTE — H&P (Signed)
Dawn Ramos 09/10/2017 12:14 PM Location: [facility] Patient #: [id number] DOB: 10-30-1976 Married / Language: Cleophus Molt / Race: White Female   History of Present Illness Stark Klein MD; 09/10/2017 1:51 PM) The patient is a 41 year old female who presents for a follow-up for Breast cancer. Pt is a 41 yo F diagnosed with right breast cancer 07/2017. The patient palpated a mass in the upper outer quadrant on the right while in bed. She went to a walk in clinic and was set up for dx imaging. She had mammo and US showing a 2.1 cm mass in the upper outer quadrant at 10-11 o'clock that was near the areolar border. This was hypoechoic and irregular. There was also a suspicious lymph node. Biopsies were performed of both lesions. The breast was positive for G2 invasive lobular carcinoma, ER/PR +, her 2 negative. ki 67 10%. The lymph node was negative. The patient has no personal history of breast cancer and no jewish ancestry of which she is aware. She has family history of melanoma and non hodgkins lymphoma in a paternal uncle. She recently quit smoking. She does not drink alcohol and is a social MJ user rarely. She is recently married and previously had not been interested in having children. She is now undecided since the marriage.   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 She has seen Dr. Iran Planas and is here to discuss bilateral nipple sparing mastectomy. She has stopped smoking as of September 17.    pathology 08/11/2017 Diagnosis 1. Breast, right, needle core biopsy, 11:00 o'clock - INVASIVE AND IN SITU MAMMARY CARCINOMA. - SEE COMMENT. 2. Breast, right, needle core biopsy, 10:00 o'clock lymph node - FIBROCYSTIC CHANGES. - LYMPH NODAL TISSUE IS NOT IDENTIFIED. - THERE IS NO EVIDENCE OF MALIGNANCY. - SEE COMMENT. Microscopic Comment 1. , 2. The carcinoma appears grade II. 1. PROGNOSTIC INDICATORS Results: IMMUNOHISTOCHEMICAL AND MORPHOMETRIC ANALYSIS PERFORMED  MANUALLY Estrogen Receptor: 100%, POSITIVE, STRONG STAINING INTENSITY Progesterone Receptor: 100%, POSITIVE, STRONG STAINING INTENSITY Proliferation Marker Ki67: 10% HER2 - NEGATIVE RATIO OF HER2/CEP17 SIGNALS 1.41 AVERAGE HER2 COPY NUMBER PER CELL 2.00 1. The malignant cells are negative for E-Cadherin, supporting a lobular phenotype.  CBC, CMET normal  DX mammogram/us 08/06/2017 CLINICAL DATA: 41 year old patient presents for evaluation of a palpable mass that she has noticed over the past 2 months or so in the upper-outer retroareolar right breast. She feels the mass best when she is sitting up with her right arm up. The mass is painless. Her mother has a recent history of breast cancer.  EXAM: 2D DIGITAL DIAGNOSTIC BILATERAL MAMMOGRAM WITH CAD AND ADJUNCT TOMO  ULTRASOUND RIGHT BREAST  COMPARISON: None  ACR Breast Density Category d: The breast tissue is extremely dense, which lowers the sensitivity of mammography.  FINDINGS: Metallic skin marker was placed in the region of the palpable mass in the periareolar upper outer right breast. Deep to the metallic skin marker is an area of architectural distortion associated with an obscured mass. The mass/distortion is estimated to be 2 cm in size on mammography, but the area is indistinct due to the extremely dense breast parenchyma. There are no suspicious microcalcifications in the right breast. No lymphadenopathy is visualized in the right axilla.  On the left, no mass or architectural distortion is detected. Negative for suspicious microcalcifications.  Mammographic images were processed with CAD.  On physical exam, there is a firm fixed mass measuring approximately 2 cm size in the retroareolar right breast 11 o'clock position.  I do not palpate any axillary lymphadenopathy.  Targeted ultrasound is performed, showing a hypoechoic irregular mass at 11 o'clock position retroareolar measuring 2.1 x 1.5 x 1.1 cm. The  deep margin of the mass is immediately adjacent to the pectoralis muscle. A small area of vascular flow is seen within the superficial aspect of the mass.  A markedly hypoechoic lymph node measuring 1.1 x 0.5 x 1.3 cm is imaged in the axillary tail of the right breast at 10 o'clock position. Ultrasound of the right axilla demonstrates a single small lymph node with a very thin cortex and normal fatty hilum. No suspicious lymph nodes are identified within the axilla.  IMPRESSION: 1. Palpable 2.1 cm mass in the 11 o'clock retroareolar right breast is highly suspicious for malignancy. 2. 1.3 cm markedly hypoechoic lymph node in the 10 o'clock position of the right breast axillary tail is indeterminate. Involvement with metastatic carcinoma cannot be excluded. 3. No suspicious lymph nodes are identified within the right axilla. 4. Extremely dense breast parenchyma. If breast biopsy is positive for malignancy, breast MRI is suggested. 5. No evidence of malignancy in the left breast.  RECOMMENDATION: 5: Highly suggestive of malignancy.  I have discussed the findings and recommendations with the patient. Results were also provided in writing at the conclusion of the visit. If applicable, a reminder letter will be sent to the patient regarding the next appointment.  BI-RADS CATEGORY 5: Highly suggestive of malignancy.   Allergies (Tanisha A. Owens Shark, RMA; 09/10/2017 12:15 PM) Macrobid *URINARY ANTI-INFECTIVES*  Edema. Allergies Reconciled   Medication History (Tanisha A. Owens Shark, [place]; 09/10/2017 12:16 PM) CarBAMazepine (200MG Tablet, Oral) Active. Escitalopram Oxalate (10MG Tablet, Oral) Active. Equetro (200MG Capsule ER 12HR, Oral) Active. Lexapro (10MG Tablet, Oral) Active. Vyvanse (50MG Capsule, Oral) Active. Medications Reconciled    Review of Systems Stark Klein MD; 09/10/2017 1:51 PM) All other systems negative  Vitals (Tanisha A. Brown RMA; 09/10/2017 12:15  PM) 09/10/2017 12:15 PM Weight: 124.4 lb Height: 67in Body Surface Area: 1.65 m Body Mass Index: 19.48 kg/m  Temp.: 13F  Pulse: 95 (Regular)  BP: 118/82 (Sitting, Left Arm, Standard)       Physical Exam Stark Klein MD; 09/10/2017 1:52 PM) General Mental Status-Alert. General Appearance-Consistent with stated age. Hydration-Well hydrated. Voice-Normal.  Chest and Lung Exam Chest and lung exam reveals -quiet, even and easy respiratory effort with no use of accessory muscles. Inspection Chest Wall - Normal. Back - normal.  Neurologic Neurologic evaluation reveals -alert and oriented x 3 with no impairment of recent or remote memory. Mental Status-Normal.  Musculoskeletal Global Assessment -Note: no gross deformities.  Normal Exam - Left-Upper Extremity Strength Normal and Lower Extremity Strength Normal. Normal Exam - Right-Upper Extremity Strength Normal and Lower Extremity Strength Normal.    Assessment & Plan Stark Klein MD; 09/10/2017 1:55 PM) PRIMARY CANCER OF UPPER OUTER QUADRANT OF RIGHT FEMALE BREAST (C50.411) Impression: May need to start tamoxifen due to delay for tobacco use. Will communicate with team.  Will discuss surgery with Dr. Iran Planas. 30 min spent in evaluation, examination, counseling, and coordination of care. >50% spent in counseling. Current Plans You are being scheduled for surgery- Our schedulers will call you.  You should hear from our office's scheduling department within 5 working days about the location, date, and time of surgery. We try to make accommodations for patient's preferences in scheduling surgery, but sometimes the OR schedule or the surgeon's schedule prevents [place] from making those accommodations.  If you have not heard  from our office [phone]) in 5 working days, call the office and ask for your surgeon's nurse.  If you have other questions about your diagnosis, plan, or surgery,  call the office and ask for your surgeon's nurse.  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    Signed by Stark Klein, MD (09/10/2017 1:56 PM)

## 2017-10-07 ENCOUNTER — Ambulatory Visit (HOSPITAL_COMMUNITY)
Admission: RE | Admit: 2017-10-07 | Discharge: 2017-10-07 | Disposition: A | Discharge status: Home / Self Care | Payer: BLUE CROSS/BLUE SHIELD | Source: Ambulatory Visit | Attending: General Surgery | Admitting: General Surgery

## 2017-10-07 ENCOUNTER — Ambulatory Visit (HOSPITAL_COMMUNITY): Payer: BLUE CROSS/BLUE SHIELD | Admitting: Anesthesiology

## 2017-10-07 ENCOUNTER — Encounter (HOSPITAL_COMMUNITY): Payer: Self-pay

## 2017-10-07 ENCOUNTER — Encounter (HOSPITAL_COMMUNITY)
Admission: RE | Disposition: A | Discharge status: Home / Self Care | Payer: Self-pay | Source: Ambulatory Visit | Attending: Plastic Surgery

## 2017-10-07 ENCOUNTER — Observation Stay (HOSPITAL_COMMUNITY)
Admission: RE | Admit: 2017-10-07 | Discharge: 2017-10-08 | Disposition: A | Discharge status: Home / Self Care | Payer: BLUE CROSS/BLUE SHIELD | Source: Ambulatory Visit | Attending: Plastic Surgery | Admitting: Plastic Surgery

## 2017-10-07 ENCOUNTER — Ambulatory Visit (HOSPITAL_COMMUNITY): Payer: BLUE CROSS/BLUE SHIELD | Admitting: Emergency Medicine

## 2017-10-07 DIAGNOSIS — Z170 Estrogen receptor positive status [ER+]: Secondary | ICD-10-CM | POA: Diagnosis not present

## 2017-10-07 DIAGNOSIS — C50411 Malignant neoplasm of upper-outer quadrant of right female breast: Secondary | ICD-10-CM

## 2017-10-07 DIAGNOSIS — C50911 Malignant neoplasm of unspecified site of right female breast: Secondary | ICD-10-CM | POA: Diagnosis present

## 2017-10-07 DIAGNOSIS — Z881 Allergy status to other antibiotic agents status: Secondary | ICD-10-CM | POA: Diagnosis not present

## 2017-10-07 DIAGNOSIS — Z87891 Personal history of nicotine dependence: Secondary | ICD-10-CM | POA: Insufficient documentation

## 2017-10-07 DIAGNOSIS — Z808 Family history of malignant neoplasm of other organs or systems: Secondary | ICD-10-CM | POA: Diagnosis not present

## 2017-10-07 DIAGNOSIS — Z79899 Other long term (current) drug therapy: Secondary | ICD-10-CM | POA: Diagnosis not present

## 2017-10-07 HISTORY — PX: BREAST RECONSTRUCTION WITH PLACEMENT OF TISSUE EXPANDER AND FLEX HD (ACELLULAR HYDRATED DERMIS): SHX6295

## 2017-10-07 HISTORY — PX: NIPPLE SPARING MASTECTOMY/SENTINAL LYMPH NODE BIOPSY/RECONSTRUCTION/PLACEMENT OF TISSUE EXPANDER: SHX6484

## 2017-10-07 SURGERY — NIPPLE SPARING MASTECTOMY WITH SENTINAL LYMPH NODE BIOPSY AND  RECONSTRUCTION WITH PLACEMENT OF TISSUE EXPANDER
Anesthesia: General | Site: Breast | Laterality: Bilateral

## 2017-10-07 MED ORDER — FENTANYL CITRATE (PF) 100 MCG/2ML IJ SOLN
100.0000 ug | Freq: Once | INTRAMUSCULAR | Status: AC
  Administered 2017-10-07: 50 ug via INTRAVENOUS
  Filled 2017-10-07: qty 2

## 2017-10-07 MED ORDER — FENTANYL CITRATE (PF) 100 MCG/2ML IJ SOLN
INTRAMUSCULAR | Status: AC
Start: 2017-10-07 — End: 2017-10-08
  Filled 2017-10-07: qty 2

## 2017-10-07 MED ORDER — CEFAZOLIN SODIUM 1 G IJ SOLR
INTRAMUSCULAR | Status: AC
  Filled 2017-10-07: qty 20

## 2017-10-07 MED ORDER — BUPIVACAINE-EPINEPHRINE (PF) 0.25% -1:200000 IJ SOLN
INTRAMUSCULAR | Status: DC | PRN
  Administered 2017-10-07: 15 mL
  Administered 2017-10-07: 15 mL via PERINEURAL

## 2017-10-07 MED ORDER — MIDAZOLAM HCL 2 MG/2ML IJ SOLN
INTRAMUSCULAR | Status: AC
  Administered 2017-10-07: 2 mg via INTRAVENOUS
  Filled 2017-10-07: qty 2

## 2017-10-07 MED ORDER — ONDANSETRON HCL 4 MG/2ML IJ SOLN
INTRAMUSCULAR | Status: AC
  Filled 2017-10-07: qty 2

## 2017-10-07 MED ORDER — CELECOXIB 200 MG PO CAPS
200.0000 mg | ORAL_CAPSULE | ORAL | Status: AC
  Administered 2017-10-07: 200 mg via ORAL
  Filled 2017-10-07: qty 1

## 2017-10-07 MED ORDER — ESCITALOPRAM OXALATE 10 MG PO TABS
10.0000 mg | ORAL_TABLET | Freq: Every day | ORAL | Status: DC
  Administered 2017-10-07: 10 mg via ORAL
  Filled 2017-10-07: qty 1

## 2017-10-07 MED ORDER — ROCURONIUM BROMIDE 50 MG/5ML IV SOLN
INTRAVENOUS | Status: AC
  Filled 2017-10-07: qty 1

## 2017-10-07 MED ORDER — ENOXAPARIN SODIUM 40 MG/0.4ML ~~LOC~~ SOLN
40.0000 mg | SUBCUTANEOUS | Status: DC
  Administered 2017-10-08: 40 mg via SUBCUTANEOUS
  Filled 2017-10-07: qty 0.4

## 2017-10-07 MED ORDER — ACETAMINOPHEN 500 MG PO TABS
1000.0000 mg | ORAL_TABLET | ORAL | Status: AC
  Administered 2017-10-07: 1000 mg via ORAL
  Filled 2017-10-07: qty 2

## 2017-10-07 MED ORDER — FENTANYL CITRATE (PF) 100 MCG/2ML IJ SOLN
25.0000 ug | INTRAMUSCULAR | Status: DC | PRN
  Administered 2017-10-07: 50 ug via INTRAVENOUS

## 2017-10-07 MED ORDER — FENTANYL CITRATE (PF) 100 MCG/2ML IJ SOLN
INTRAMUSCULAR | Status: AC
  Administered 2017-10-07: 50 ug via INTRAVENOUS
  Filled 2017-10-07: qty 2

## 2017-10-07 MED ORDER — OXYCODONE HCL 5 MG PO TABS
5.0000 mg | ORAL_TABLET | ORAL | Status: DC | PRN
  Administered 2017-10-07 – 2017-10-08 (×4): 10 mg via ORAL
  Filled 2017-10-07 (×4): qty 2

## 2017-10-07 MED ORDER — PROPOFOL 10 MG/ML IV BOLUS
INTRAVENOUS | Status: AC
  Filled 2017-10-07: qty 20

## 2017-10-07 MED ORDER — 0.9 % SODIUM CHLORIDE (POUR BTL) OPTIME
TOPICAL | Status: DC | PRN
  Administered 2017-10-07 (×3): 1000 mL

## 2017-10-07 MED ORDER — ROPIVACAINE HCL 7.5 MG/ML IJ SOLN
INTRAMUSCULAR | Status: DC | PRN
  Administered 2017-10-07 (×2): 5 mL via PERINEURAL

## 2017-10-07 MED ORDER — KCL IN DEXTROSE-NACL 20-5-0.45 MEQ/L-%-% IV SOLN
INTRAVENOUS | Status: AC
  Administered 2017-10-07: 75 mL/h via INTRAVENOUS
  Filled 2017-10-07: qty 1000

## 2017-10-07 MED ORDER — DEXAMETHASONE SODIUM PHOSPHATE 10 MG/ML IJ SOLN
INTRAMUSCULAR | Status: AC
  Filled 2017-10-07: qty 1

## 2017-10-07 MED ORDER — FENTANYL CITRATE (PF) 100 MCG/2ML IJ SOLN
INTRAMUSCULAR | Status: DC | PRN
  Administered 2017-10-07: 50 ug via INTRAVENOUS
  Administered 2017-10-07: 150 ug via INTRAVENOUS
  Administered 2017-10-07: 50 ug via INTRAVENOUS

## 2017-10-07 MED ORDER — CHLORHEXIDINE GLUCONATE CLOTH 2 % EX PADS: 6.0000 | MEDICATED_PAD | Freq: Once | CUTANEOUS | Status: DC

## 2017-10-07 MED ORDER — OXYCODONE HCL 5 MG PO TABS
ORAL_TABLET | ORAL | Status: AC
  Filled 2017-10-07: qty 2

## 2017-10-07 MED ORDER — CARBAMAZEPINE ER 200 MG PO TB12
200.0000 mg | ORAL_TABLET | Freq: Every day | ORAL | Status: DC
  Administered 2017-10-07: 200 mg via ORAL
  Filled 2017-10-07 (×2): qty 1

## 2017-10-07 MED ORDER — CEFAZOLIN SODIUM-DEXTROSE 1-4 GM/50ML-% IV SOLN
1.0000 g | Freq: Three times a day (TID) | INTRAVENOUS | Status: DC
  Administered 2017-10-07 – 2017-10-08 (×2): 1 g via INTRAVENOUS
  Filled 2017-10-07 (×3): qty 50

## 2017-10-07 MED ORDER — HYDROMORPHONE HCL 1 MG/ML IJ SOLN: 0.5000 mg | INTRAMUSCULAR | Status: DC | PRN

## 2017-10-07 MED ORDER — ONDANSETRON HCL 4 MG/2ML IJ SOLN
INTRAMUSCULAR | Status: DC | PRN
  Administered 2017-10-07: 4 mg via INTRAVENOUS

## 2017-10-07 MED ORDER — PROPOFOL 10 MG/ML IV BOLUS
INTRAVENOUS | Status: DC | PRN
  Administered 2017-10-07: 10 mg via INTRAVENOUS
  Administered 2017-10-07: 140 mg via INTRAVENOUS

## 2017-10-07 MED ORDER — ONDANSETRON 4 MG PO TBDP: 4.0000 mg | ORAL_TABLET | Freq: Four times a day (QID) | ORAL | Status: DC | PRN

## 2017-10-07 MED ORDER — ONDANSETRON HCL 4 MG/2ML IJ SOLN
4.0000 mg | Freq: Four times a day (QID) | INTRAMUSCULAR | Status: DC | PRN
  Administered 2017-10-07: 4 mg via INTRAVENOUS

## 2017-10-07 MED ORDER — MIDAZOLAM HCL 2 MG/2ML IJ SOLN
2.0000 mg | Freq: Once | INTRAMUSCULAR | Status: AC
  Administered 2017-10-07: 2 mg via INTRAVENOUS
  Filled 2017-10-07: qty 2

## 2017-10-07 MED ORDER — GABAPENTIN 300 MG PO CAPS
300.0000 mg | ORAL_CAPSULE | ORAL | Status: AC
  Administered 2017-10-07: 300 mg via ORAL
  Filled 2017-10-07: qty 1

## 2017-10-07 MED ORDER — TECHNETIUM TC 99M SULFUR COLLOID FILTERED
1.0000 | Freq: Once | INTRAVENOUS | Status: AC | PRN
  Administered 2017-10-07: 1 via INTRADERMAL

## 2017-10-07 MED ORDER — LISDEXAMFETAMINE DIMESYLATE 50 MG PO CAPS: 50.0000 mg | ORAL_CAPSULE | Freq: Every day | ORAL | Status: DC

## 2017-10-07 MED ORDER — LISDEXAMFETAMINE DIMESYLATE 30 MG PO CAPS
50.0000 mg | ORAL_CAPSULE | Freq: Every day | ORAL | Status: DC
  Filled 2017-10-07: qty 1

## 2017-10-07 MED ORDER — LIDOCAINE 2% (20 MG/ML) 5 ML SYRINGE
INTRAMUSCULAR | Status: DC | PRN
  Administered 2017-10-07: 100 mg via INTRAVENOUS

## 2017-10-07 MED ORDER — FENTANYL CITRATE (PF) 250 MCG/5ML IJ SOLN
INTRAMUSCULAR | Status: AC
  Filled 2017-10-07: qty 5

## 2017-10-07 MED ORDER — GABAPENTIN 300 MG PO CAPS
300.0000 mg | ORAL_CAPSULE | Freq: Two times a day (BID) | ORAL | Status: DC
  Administered 2017-10-07 – 2017-10-08 (×2): 300 mg via ORAL
  Filled 2017-10-07 (×2): qty 1

## 2017-10-07 MED ORDER — HEPARIN SODIUM (PORCINE) 5000 UNIT/ML IJ SOLN
5000.0000 [IU] | Freq: Once | INTRAMUSCULAR | Status: AC
  Administered 2017-10-07: 5000 [IU] via SUBCUTANEOUS
  Filled 2017-10-07: qty 1

## 2017-10-07 MED ORDER — SODIUM CHLORIDE 0.9 % IV SOLN
Freq: Once | INTRAVENOUS | Status: DC
  Filled 2017-10-07: qty 1

## 2017-10-07 MED ORDER — METHOCARBAMOL 500 MG PO TABS
ORAL_TABLET | ORAL | Status: AC
  Filled 2017-10-07: qty 1

## 2017-10-07 MED ORDER — KCL IN DEXTROSE-NACL 20-5-0.45 MEQ/L-%-% IV SOLN
INTRAVENOUS | Status: DC
  Administered 2017-10-07: 75 mL/h via INTRAVENOUS
  Administered 2017-10-08: 06:00:00 via INTRAVENOUS
  Filled 2017-10-07: qty 1000

## 2017-10-07 MED ORDER — METHOCARBAMOL 500 MG PO TABS
500.0000 mg | ORAL_TABLET | Freq: Three times a day (TID) | ORAL | Status: DC | PRN
  Administered 2017-10-07 – 2017-10-08 (×2): 500 mg via ORAL
  Filled 2017-10-07: qty 1

## 2017-10-07 MED ORDER — ROCURONIUM BROMIDE 10 MG/ML (PF) SYRINGE
PREFILLED_SYRINGE | INTRAVENOUS | Status: DC | PRN
  Administered 2017-10-07: 40 mg via INTRAVENOUS

## 2017-10-07 MED ORDER — CEFAZOLIN SODIUM-DEXTROSE 2-4 GM/100ML-% IV SOLN
2.0000 g | INTRAVENOUS | Status: AC
  Administered 2017-10-07 (×2): 2 g via INTRAVENOUS
  Filled 2017-10-07: qty 100

## 2017-10-07 MED ORDER — EPHEDRINE SULFATE-NACL 50-0.9 MG/10ML-% IV SOSY
PREFILLED_SYRINGE | INTRAVENOUS | Status: DC | PRN
  Administered 2017-10-07: 5 mg via INTRAVENOUS
  Administered 2017-10-07 (×3): 10 mg via INTRAVENOUS
  Administered 2017-10-07: 5 mg via INTRAVENOUS
  Administered 2017-10-07: 10 mg via INTRAVENOUS

## 2017-10-07 MED ORDER — CELECOXIB 200 MG PO CAPS
200.0000 mg | ORAL_CAPSULE | Freq: Two times a day (BID) | ORAL | Status: DC
  Administered 2017-10-07 – 2017-10-08 (×2): 200 mg via ORAL
  Filled 2017-10-07 (×2): qty 1

## 2017-10-07 MED ORDER — SCOPOLAMINE 1 MG/3DAYS TD PT72
MEDICATED_PATCH | TRANSDERMAL | Status: DC | PRN
  Administered 2017-10-07: 1 via TRANSDERMAL

## 2017-10-07 MED ORDER — MIDAZOLAM HCL 2 MG/2ML IJ SOLN
INTRAMUSCULAR | Status: AC
  Filled 2017-10-07: qty 2

## 2017-10-07 MED ORDER — LIDOCAINE 2% (20 MG/ML) 5 ML SYRINGE
INTRAMUSCULAR | Status: AC
  Filled 2017-10-07: qty 5

## 2017-10-07 MED ORDER — DEXAMETHASONE SODIUM PHOSPHATE 10 MG/ML IJ SOLN
INTRAMUSCULAR | Status: DC | PRN
  Administered 2017-10-07: 10 mg via INTRAVENOUS

## 2017-10-07 MED ORDER — SODIUM CHLORIDE 0.9 % IV SOLN
INTRAVENOUS | Status: DC | PRN
  Administered 2017-10-07: 12:00:00

## 2017-10-07 MED ORDER — LACTATED RINGERS IV SOLN
INTRAVENOUS | Status: DC | PRN
  Administered 2017-10-07: 09:00:00 via INTRAVENOUS

## 2017-10-07 SURGICAL SUPPLY — 88 items
ADH SKN CLS APL DERMABOND .7 (GAUZE/BANDAGES/DRESSINGS) ×1
ALLODERM RTU MEDIUM BILATERAL (Tissue) ×4 IMPLANT
ATCH SMKEVC FLXB CAUT HNDSWH (FILTER) ×1 IMPLANT
BAG DECANTER FOR FLEXI CONT (MISCELLANEOUS) ×2 IMPLANT
BINDER BREAST LRG (GAUZE/BANDAGES/DRESSINGS) ×1 IMPLANT
BINDER BREAST XLRG (GAUZE/BANDAGES/DRESSINGS) IMPLANT
CANISTER SUCT 3000ML PPV (MISCELLANEOUS) ×6 IMPLANT
CHLORAPREP W/TINT 26ML (MISCELLANEOUS) ×4 IMPLANT
CLIP VESOCCLUDE MED 24/CT (CLIP) ×2 IMPLANT
CLIP VESOCCLUDE SM WIDE 24/CT (CLIP) ×2 IMPLANT
CLSR STERI-STRIP ANTIMIC 1/2X4 (GAUZE/BANDAGES/DRESSINGS) ×2 IMPLANT
COVER PROBE W GEL 5X96 (DRAPES) ×1 IMPLANT
COVER SURGICAL LIGHT HANDLE (MISCELLANEOUS) ×4 IMPLANT
DERMABOND ADVANCED (GAUZE/BANDAGES/DRESSINGS) ×1
DERMABOND ADVANCED .7 DNX12 (GAUZE/BANDAGES/DRESSINGS) ×2 IMPLANT
DRAIN CHANNEL 15F RND FF W/TCR (WOUND CARE) IMPLANT
DRAPE HALF SHEET 40X57 (DRAPES) IMPLANT
DRAPE INCISE IOBAN 66X45 STRL (DRAPES) IMPLANT
DRAPE ORTHO SPLIT 77X108 STRL (DRAPES) ×4
DRAPE SURG ORHT 6 SPLT 77X108 (DRAPES) ×2 IMPLANT
DRAPE UTILITY XL STRL (DRAPES) ×4 IMPLANT
DRAPE WARM FLUID 44X44 (DRAPE) ×2 IMPLANT
DRSG PAD ABDOMINAL 8X10 ST (GAUZE/BANDAGES/DRESSINGS) ×2 IMPLANT
DRSG TEGADERM 2-3/8X2-3/4 SM (GAUZE/BANDAGES/DRESSINGS) ×2 IMPLANT
DRSG TEGADERM 4X4.75 (GAUZE/BANDAGES/DRESSINGS) ×5 IMPLANT
ELECT BLADE 4.0 EZ CLEAN MEGAD (MISCELLANEOUS) ×2
ELECT BLADE 6.5 EXT (BLADE) ×1 IMPLANT
ELECT CAUTERY BLADE 6.4 (BLADE) ×4 IMPLANT
ELECT COATED BLADE 2.86 ST (ELECTRODE) ×2 IMPLANT
ELECT REM PT RETURN 9FT ADLT (ELECTROSURGICAL) ×4
ELECTRODE BLDE 4.0 EZ CLN MEGD (MISCELLANEOUS) ×1 IMPLANT
ELECTRODE REM PT RTRN 9FT ADLT (ELECTROSURGICAL) ×3 IMPLANT
EVACUATOR SILICONE 100CC (DRAIN) IMPLANT
EVACUATOR SMOKE ACCUVAC VALLEY (FILTER) ×1
EXPANDER BREAST TISSUE 400CC (Breast) ×2 IMPLANT
GAUZE SPONGE 4X4 12PLY STRL (GAUZE/BANDAGES/DRESSINGS) ×4 IMPLANT
GLOVE BIO SURGEON STRL SZ 6 (GLOVE) ×8 IMPLANT
GLOVE BIO SURGEON STRL SZ7 (GLOVE) ×1 IMPLANT
GLOVE BIO SURGEON STRL SZ7.5 (GLOVE) ×1 IMPLANT
GLOVE BIOGEL PI IND STRL 6.5 (GLOVE) ×1 IMPLANT
GLOVE BIOGEL PI IND STRL 7.5 (GLOVE) IMPLANT
GLOVE BIOGEL PI INDICATOR 6.5 (GLOVE) ×1
GLOVE BIOGEL PI INDICATOR 7.5 (GLOVE) ×1
GOWN STRL REUS W/ TWL LRG LVL3 (GOWN DISPOSABLE) ×3 IMPLANT
GOWN STRL REUS W/TWL 2XL LVL3 (GOWN DISPOSABLE) ×2 IMPLANT
GOWN STRL REUS W/TWL LRG LVL3 (GOWN DISPOSABLE) ×6
ILLUMINATOR WAVEGUIDE N/F (MISCELLANEOUS) IMPLANT
KIT BASIN OR (CUSTOM PROCEDURE TRAY) ×4 IMPLANT
KIT ROOM TURNOVER OR (KITS) ×4 IMPLANT
LIGHT WAVEGUIDE WIDE FLAT (MISCELLANEOUS) ×1 IMPLANT
MARKER SKIN DUAL TIP RULER LAB (MISCELLANEOUS) ×2 IMPLANT
NDL FILTER BLUNT 18X1 1/2 (NEEDLE) IMPLANT
NEEDLE FILTER BLUNT 18X 1/2SAF (NEEDLE)
NEEDLE FILTER BLUNT 18X1 1/2 (NEEDLE) IMPLANT
NS IRRIG 1000ML POUR BTL (IV SOLUTION) ×6 IMPLANT
PACK GENERAL/GYN (CUSTOM PROCEDURE TRAY) ×4 IMPLANT
PAD ABD 8X10 STRL (GAUZE/BANDAGES/DRESSINGS) ×1 IMPLANT
PAD ARMBOARD 7.5X6 YLW CONV (MISCELLANEOUS) ×4 IMPLANT
PIN SAFETY STERILE (MISCELLANEOUS) ×2 IMPLANT
PREFILTER EVAC NS 1 1/3-3/8IN (MISCELLANEOUS) ×2 IMPLANT
PREFILTER SMOKE EVAC (FILTER) ×2 IMPLANT
PUNCH BIOPSY 4MM (MISCELLANEOUS) ×2
PUNCH BIOPSY DISP 4 (MISCELLANEOUS) IMPLANT
SET ASEPTIC TRANSFER (MISCELLANEOUS) ×2 IMPLANT
SOL PREP POV-IOD 4OZ 10% (MISCELLANEOUS) ×2 IMPLANT
SPONGE LAP 18X18 X RAY DECT (DISPOSABLE) ×2 IMPLANT
STAPLER VISISTAT 35W (STAPLE) ×2 IMPLANT
SUT CHROMIC 4 0 PS 2 18 (SUTURE) ×8 IMPLANT
SUT ETHILON 2 0 FS 18 (SUTURE) ×6 IMPLANT
SUT MNCRL 3 0 RB1 (SUTURE) IMPLANT
SUT MNCRL AB 4-0 PS2 18 (SUTURE) ×2 IMPLANT
SUT MON AB 4-0 PC3 18 (SUTURE) ×1 IMPLANT
SUT MONOCRYL 3 0 RB1 (SUTURE)
SUT VIC AB 0 CT1 27 (SUTURE) ×8
SUT VIC AB 0 CT1 27XBRD ANBCTR (SUTURE) IMPLANT
SUT VIC AB 3-0 SH 27 (SUTURE) ×4
SUT VIC AB 3-0 SH 27X BRD (SUTURE) IMPLANT
SUT VIC AB 3-0 SH 8-18 (SUTURE) ×1 IMPLANT
SUT VICRYL 3 0 (SUTURE) IMPLANT
SUT VICRYL 4-0 PS2 18IN ABS (SUTURE) ×2 IMPLANT
SUT VLOC 180 0 24IN GS25 (SUTURE) ×2 IMPLANT
SYR BULB IRRIGATION 50ML (SYRINGE) ×2 IMPLANT
TAPE STRIPS DRAPE STRL (GAUZE/BANDAGES/DRESSINGS) ×2 IMPLANT
TISSUE ALLDRM RTU MEDIUM BLTRL (Tissue) IMPLANT
TOWEL OR 17X24 6PK STRL BLUE (TOWEL DISPOSABLE) ×4 IMPLANT
TOWEL OR 17X26 10 PK STRL BLUE (TOWEL DISPOSABLE) ×4 IMPLANT
TRAY FOLEY W/METER SILVER 16FR (SET/KITS/TRAYS/PACK) IMPLANT
TUBE CONNECTING 12X1/4 (SUCTIONS) ×4 IMPLANT

## 2017-10-07 NOTE — Op Note (Signed)
Operative Note   DATE OF OPERATION: 10.18.18  LOCATION: [facility]  SURGICAL DIVISION: Plastic Surgery  PREOPERATIVE DIAGNOSES:  1. Right breast cancer ER+ upper outer quadrant 2. CHEK2 mutation monoallelic  POSTOPERATIVE DIAGNOSES:  same  PROCEDURE:  1. Bilateral breast reconstruction with tissue expanders 2. Acelluar dermis for breast reconstruction 500 cm2  SURGEON: Irene Limbo MD MBA  ASSISTANTThedora Hinders RNFA  ANESTHESIA:  General.   EBL: 150 ml for entire procedure  COMPLICATIONS: None immediate.   INDICATIONS FOR PROCEDURE:  The patient, Dawn Ramos, is a 41 y.o. female born on 23-Jan-1976, is here for immediate prepectoral expander based reconstruction following bilateral nipple sparing mastectomies.   FINDINGS: Natrelle 133FV-12-T 400 ml tissue expanders placed bilateral, initial fill 300 ml air RIGHT SN [id number] LEFT [id number]  DESCRIPTION OF PROCEDURE:  Following induction of general anesthesia, Foley catheter placed. The patient's operative site was prepped and draped in a sterile fashion. A time out was performed and all information was confirmed to be correct. Following completion of mastectomies, reconstruction began on left side.  Two perforations of mastectomy skin flap incurred during mastectomy flap dissection over upper outer quadrant with associated cautery burn. The skin was excised to make one wound. There was dermal bleeding noted of remaining area of thermal burn and this was left in place. Wound was closed with 3-0 vicryl in dermis and running 4-0 chromic for skin closure. The cavity was irrigated with solution containing Ancef, gentamicin, and bacitracin. Hemostasis was ensured. A 19 Fr drain was placed in subcutaneous position laterally and a 15 Fr drain placed along inframammary fold. Each secured to skin with 2-0 nylon. Cavity irrigated with Betadine. The tissue expanders were prepared on back table prior in insertion. The expander  was filled with air to 300 ml. Perforated acellular dermis was draped over anterior surface expander. The ADM was then secured to itself over posterior surface of expander with 4-0 chromic suture. Redundant folds acellular dermis excised so that the ADM lied flat without folds over air filled expander.The expander was secured to fascia over lateral sternal borderwith a 0 vicryl. Thelateral tab was also secured to pectoralis muscle with 0-vicryl. The ADM was secured to pectoralis muscle and chest wall along inferior border at inframammary fold with 0 V-lock suture. Skin closure completedwith 3-0 vicryl in fascial layer and 4-0 vicryl in dermis. Skin closure completed with 4-0 monocryl subcuticular and tissue adhesive.  I then directed my attention to right chest where similar irrigation and drain placement completed. The prepared expander with ADM secured over anterior surface was placed in right chest and tabs secured to chest wall and pectoralis muscle with 0- vicryl suture. The acellular dermis at inframammary fold was secured to chest wall with 0 V-lock suture. Skin closure completedwith 3-0 vicryl in fascial layer and 4-0 vicryl in dermis. Skin closure completed with 4-0 monocryl subcuticular and tissue adhesive. The mastectomy flaps were redraped so that NAC was symmetric from midline and sternal notch. Tegaderm dressings applied followed by breast binder.  The patient was allowed to wake from anesthesia, extubated and taken to the recovery room in satisfactory condition.   SPECIMENS: none  DRAINS: 15 Fr and 19 Fr JP in right and left breast reconstruction  Irene Limbo, MD [facility] Plastic [facility] Reconstructive Surgery [phone], pin [phone]

## 2017-10-07 NOTE — Progress Notes (Signed)
Pt tried to use [facility] and tried to urinate again. Pt got dizzy again but not as bad as earlier tonight. VSS. Still unable to urinate. Bladder scan showed 34 ml urine in the bladder. WIll continue to monitor pt.

## 2017-10-07 NOTE — Anesthesia Procedure Notes (Signed)
Procedure Name: Intubation Date/Time: 10/07/2017 9:54 AM Performed by: Melina Copa, Korene Dula R Pre-anesthesia Checklist: Patient identified, Emergency Drugs available, Suction available and Patient being monitored Patient Re-evaluated:Patient Re-evaluated prior to induction Oxygen Delivery Method: Circle System Utilized Preoxygenation: Pre-oxygenation with 100% oxygen Induction Type: IV induction Ventilation: Mask ventilation without difficulty Laryngoscope Size: Mac and 3 Grade View: Grade II Tube type: Oral Tube size: 7.5 mm Number of attempts: 1 Airway Equipment and Method: Stylet Placement Confirmation: ETT inserted through vocal cords under direct vision,  positive ETCO2 and breath sounds checked- equal and bilateral Secured at: 21 cm Tube secured with: Tape Dental Injury: Teeth and Oropharynx as per pre-operative assessment

## 2017-10-07 NOTE — Anesthesia Procedure Notes (Signed)
Anesthesia Regional Block: Pectoralis block   Pre-Anesthetic Checklist: ,, timeout performed, Correct Patient, Correct Site, Correct Laterality, Correct Procedure, Correct Position, site marked, Risks and benefits discussed, pre-op evaluation,  At surgeon's request and post-op pain management  Laterality: Right  Prep: chloraprep       Needles:   Needle Type: Echogenic Needle     Needle Length: 4cm  Needle Gauge: 21     Additional Needles:   Procedures:,,,, ultrasound used (permanent image in chart),,,,  Narrative:  Start time: 10/07/2017 8:42 AM End time: 10/07/2017 8:47 AM Injection made incrementally with aspirations every 5 mL. Anesthesiologist: Lyndle Herrlich

## 2017-10-07 NOTE — Op Note (Signed)
Bilateral Nipple sparing Mastectomy with right Sentinel Node Biopsy Procedure Note  Indications: This patient presents with history of right breast cancer with clinically negative axillary lymph node exam.  Breast density is D.  She also has a CHEK2 mutation.    Pre-operative Diagnosis: right breast cancer, upper outer quadrant, invasive lobular cancer cT2N0, +/+/- Post-operative Diagnosis: right breast cancer, same  Surgeon: [id number],XUMZY   Assistant:  Magnus Ivan, RNFA and Glenna Fellows, MD  Anesthesia: General endotracheal anesthesia and Local anesthesia 0.25.% bupivacaine, with epinephrine  ASA Class: 2  Procedure Details  The patient was seen in the Holding Room. The risks, benefits, complications, treatment options, and expected outcomes were discussed with the patient. The possibilities of reaction to medication, pulmonary aspiration, bleeding, infection, the need for additional procedures, failure to diagnose a condition, and creating a complication requiring transfusion or operation were discussed with the patient. The patient concurred with the proposed plan, giving informed consent.  The site of surgery properly noted/marked. The patient was taken to Operating Room # 2, identified as Dawn Ramos, and the procedure verified as Bilateral Nipple Sparing Mastectomy and right Sentinel Node Biopsy. A Time Out was held and the above information confirmed.    After induction of anesthesia, the right arm, breast, and chest were prepped and draped in standard fashion.  The borders of the breast were identified and marked by Dr. Leta Baptist.  The left side was addressed first.  An inframammary incision was made with a #10 blade.  Mastectomy hooks were used to provide elevation of the skin edges, and the cautery was used to create the mastectomy flaps.  The subareolar breast tissue was marked.  The dissection was taken to the fascia of the pectoralis major.  The penetrating vessels were clipped  as needed.  The superior flap was taken medially to the lateral sternal border, superiorly to the inferior border of the clavicle.  The inferior flap was similarly created, inferiorly to the inframammary fold and laterally to the border of the latissimus.  The breast was taken off including the pectoralis fascia and the axillary tail marked.  A small buttonhole was made in the axilla.  This was closed by Dr. Leta Baptist.  Hemostasis was achieved with cautery.    The right side was then addressed.    Using a hand-held gamma probe, axillary sentinel nodes were identified.  Five deep level 2 axillary sentinel nodes were removed and submitted to pathology.  These were all hot.  The findings are below.  The lymphovascular channels were clipped with metal clips. Some of the superior breast tissue was freed up with the cautery via the axillary incision.    A symmetric inframammary incision was made.  The skin was elevated with mastectomy hooks, then lighted retractors.    The right breast was freed from the skin.  The flaps were created to the clavicle superiorly, the lateral sternal border medially, and the latissimus laterally.  The breast was then taken off the pectoralis fascia.  This was irrigated and hemostasis was achieved with cautery.    The patient was then left with Dr. Leta Baptist for the reconstruction.    Sterile dressings were applied. At the end of the operation, all sponge, instrument, and needle counts were correct.  Findings: grossly clear surgical margins.  All right sided LN were hot, with the maximal cps 3210.  Background count was 12 cps.    Estimated Blood Loss:  100 mL         Drains:  per Dr. Iran Planas.                Specimens: Left breast, right breast and 5 right axillary sentinel nodes         Complications:  None; patient tolerated the procedure well.         Disposition: PACU - hemodynamically stable.         Condition: stable

## 2017-10-07 NOTE — Transfer of Care (Signed)
Immediate Anesthesia Transfer of Care Note  Patient: Dawn Ramos  Procedure(s) Performed: BILATERAL NIPPLE SPARING MASTECTOMY WITH RIGHT SENTINEL LYMPH NODE BIOPSY (Bilateral Breast) BREAST RECONSTRUCTION WITH PLACEMENT OF TISSUE EXPANDER AND ALLODERM (Bilateral Breast)  Patient Location: PACU  Anesthesia Type:GA combined with regional for post-op pain  Level of Consciousness: oriented, drowsy and patient cooperative  Airway & Oxygen Therapy: Patient Spontanous Breathing and Patient connected to nasal cannula oxygen  Post-op Assessment: Report given to RN, Post -op Vital signs reviewed and stable and Patient moving all extremities  Post vital signs: Reviewed and stable  Last Vitals:  Vitals:   10/07/17 0800  BP: 115/78  Pulse: 78  Resp: 20  Temp: 36.5 C  SpO2: 100%    Last Pain:  Vitals:   10/07/17 0800  TempSrc: Oral      Patients Stated Pain Goal: 3 (29/02/11 1552)  Complications: No apparent anesthesia complications

## 2017-10-07 NOTE — Anesthesia Preprocedure Evaluation (Addendum)
Anesthesia Evaluation  Patient identified by MRN, date of birth, ID band Patient awake    Reviewed: Allergy & Precautions, H&P , Patient's Chart, lab work & pertinent test results, reviewed documented beta blocker date and time   Airway Mallampati: II  TM Distance: >3 FB Neck ROM: full    Dental no notable dental hx. (+) Dental Advisory Given,    Pulmonary former smoker,    Pulmonary exam normal breath sounds clear to auscultation       Cardiovascular  Rhythm:regular Rate:Normal     Neuro/Psych    GI/Hepatic   Endo/Other    Renal/GU      Musculoskeletal   Abdominal   Peds  Hematology   Anesthesia Other Findings   Reproductive/Obstetrics                            Anesthesia Physical Anesthesia Plan  ASA: II  Anesthesia Plan: General   Post-op Pain Management:  Regional for Post-op pain   Induction: Intravenous  PONV Risk Score and Plan: 2 and Ondansetron, Dexamethasone and Treatment may vary due to age or medical condition  Airway Management Planned: Oral ETT  Additional Equipment:   Intra-op Plan:   Post-operative Plan: Extubation in OR  Informed Consent: I have reviewed the patients History and Physical, chart, labs and discussed the procedure including the risks, benefits and alternatives for the proposed anesthesia with the patient or authorized representative who has indicated his/her understanding and acceptance.   Dental Advisory Given  Plan Discussed with: CRNA and Surgeon  Anesthesia Plan Comments: (  )        Anesthesia Quick Evaluation

## 2017-10-07 NOTE — Anesthesia Procedure Notes (Signed)
Anesthesia Regional Block: Pectoralis block   Pre-Anesthetic Checklist: ,, timeout performed, Correct Patient, Correct Site, Correct Laterality, Correct Procedure, Correct Position, site marked, Risks and benefits discussed, pre-op evaluation,  At surgeon's request and post-op pain management  Laterality: Left  Prep: chloraprep       Needles:   Needle Type: Echogenic Needle     Needle Length: 9cm  Needle Gauge: 21     Additional Needles:   Procedures:,,,, ultrasound used (permanent image in chart),,,,  Narrative:  Start time: 10/07/2017 8:30 AM End time: 10/07/2017 8:38 AM Injection made incrementally with aspirations every 5 mL. Anesthesiologist: Lyndle Herrlich

## 2017-10-07 NOTE — Progress Notes (Signed)
Pt admitted to 6N14  From PACU. Wanted to try to urinate. Got diaphoretic and dizzy while on the toilet. Pt still due to void. Got her on the bed pt stated she felt better. VSS. Will continue to monitor.

## 2017-10-07 NOTE — Anesthesia Postprocedure Evaluation (Signed)
Anesthesia Post Note  Patient: Dawn Ramos  Procedure(s) Performed: BILATERAL NIPPLE SPARING MASTECTOMY WITH RIGHT SENTINEL LYMPH NODE BIOPSY (Bilateral Breast) BREAST RECONSTRUCTION WITH PLACEMENT OF TISSUE EXPANDER AND ALLODERM (Bilateral Breast)     Patient location during evaluation: PACU Anesthesia Type: General Level of consciousness: awake and alert Pain management: pain level controlled Vital Signs Assessment: post-procedure vital signs reviewed and stable Respiratory status: spontaneous breathing, nonlabored ventilation, respiratory function stable and patient connected to nasal cannula oxygen Cardiovascular status: blood pressure returned to baseline and stable Postop Assessment: no apparent nausea or vomiting Anesthetic complications: no    Last Vitals:  Vitals:   10/07/17 1515 10/07/17 1530  BP: 94/60 (!) 100/56  Pulse: 75 66  Resp: (!) 6 13  Temp:    SpO2: 100% 100%    Last Pain:  Vitals:   10/07/17 1545  TempSrc:   PainSc: Asleep                 [name] S

## 2017-10-07 NOTE — Interval H&P Note (Signed)
History and Physical Interval Note:  10/07/2017 8:12 AM  Dawn Ramos  has presented today for surgery, with the diagnosis of RIGHT BREAST CANCER  The various methods of treatment have been discussed with the patient and family. After consideration of risks, benefits and other options for treatment, the patient has consented to  Procedure(s): BILATERAL NIPPLE SPARING MASTECTOMY WITH RIGHT SENTINAL LYMPH NODE BIOPSY (Bilateral) BREAST RECONSTRUCTION WITH PLACEMENT OF TISSUE EXPANDER AND ALLODERM (Bilateral) as a surgical intervention .  The patient's history has been reviewed, patient examined, no change in status, stable for surgery.  I have reviewed the patient's chart and labs.  Questions were answered to the patient's satisfaction.     Adhvik Canady

## 2017-10-07 NOTE — Interval H&P Note (Signed)
History and Physical Interval Note:  10/07/2017 9:36 AM  Dawn Ramos  has presented today for surgery, with the diagnosis of RIGHT BREAST CANCER  The various methods of treatment have been discussed with the patient and family. After consideration of risks, benefits and other options for treatment, the patient has consented to  Procedure(s): BILATERAL NIPPLE SPARING MASTECTOMY WITH RIGHT SENTINAL LYMPH NODE BIOPSY (Bilateral) BREAST RECONSTRUCTION WITH PLACEMENT OF TISSUE EXPANDER AND ALLODERM (Bilateral) as a surgical intervention .  The patient's history has been reviewed, patient examined, no change in status, stable for surgery.  I have reviewed the patient's chart and labs.  Questions were answered to the patient's satisfaction.     Ishi Danser

## 2017-10-08 DIAGNOSIS — C50411 Malignant neoplasm of upper-outer quadrant of right female breast: Secondary | ICD-10-CM | POA: Diagnosis not present

## 2017-10-08 MED ORDER — LISDEXAMFETAMINE DIMESYLATE 20 MG PO CAPS
20.0000 mg | ORAL_CAPSULE | Freq: Every day | ORAL | Status: DC
  Filled 2017-10-08: qty 1

## 2017-10-08 MED ORDER — SULFAMETHOXAZOLE-TRIMETHOPRIM 800-160 MG PO TABS: 1.0000 | ORAL_TABLET | Freq: Two times a day (BID) | ORAL | 0 refills | Status: DC

## 2017-10-08 MED ORDER — LISDEXAMFETAMINE DIMESYLATE 30 MG PO CAPS
30.0000 mg | ORAL_CAPSULE | Freq: Every day | ORAL | Status: DC
  Filled 2017-10-08: qty 1

## 2017-10-08 MED ORDER — OXYCODONE HCL 5 MG PO TABS: 5.0000 mg | ORAL_TABLET | ORAL | 0 refills | Status: DC | PRN

## 2017-10-08 MED ORDER — METHOCARBAMOL 500 MG PO TABS: 500.0000 mg | ORAL_TABLET | Freq: Three times a day (TID) | ORAL | 0 refills | Status: DC | PRN

## 2017-10-08 NOTE — Discharge Instructions (Signed)
Surgical Drain Home Care °Surgical drains are used to remove extra fluid that normally builds up in a surgical wound after surgery. A surgical drain helps to heal a surgical wound. Different kinds of surgical drains include: °· Active drains. These drains use suction to pull drainage away from the surgical wound. Drainage flows through a tube to a container outside of the body. It is important to keep the bulb or the drainage container flat (compressed) at all times, except while you empty it. Flattening the bulb or container creates suction. The two most common types of active drains are bulb drains and Hemovac drains. °· Passive drains. These drains allow fluid to drain naturally, by gravity. Drainage flows through a tube to a bandage (dressing) or a container outside of the body. Passive drains do not need to be emptied. The most common type of passive drain is the Penrose drain. ° °A drain is placed during surgery. Immediately after surgery, drainage is usually bright red and a little thicker than water. The drainage may gradually turn yellow or pink and become thinner. It is likely that your health care provider will remove the drain when the drainage stops or when the amount decreases to 1-2 Tbsp (15-30 mL) during a 24-hour period. °How to care for your surgical drain °· Keep the skin around the drain dry and covered with a dressing at all times. °· Check your drain area every day for signs of infection. Check for: °? More redness, swelling, or pain. °? Pus or a bad smell. °? Cloudy drainage. °Follow instructions from your health care provider about how to take care of your drain and how to change your dressing. Change your dressing at least one time every day. Change it more often if needed to keep the dressing dry. Make sure you: °1. Gather your supplies, including: °? Tape. °? Germ-free cleaning solution (sterile saline). °? Split gauze drain sponge: 4 x 4 inches (10 x 10 cm). °? Gauze square: 4 x 4 inches  (10 x 10 cm). °2. Wash your hands with soap and water before you change your dressing. If soap and water are not available, use hand sanitizer. °3. Remove the old dressing. Avoid using scissors to do that. °4. Use sterile saline to clean your skin around the drain. °5. Place the tube through the slit in a drain sponge. Place the drain sponge so that it covers your wound. °6. Place the gauze square or another drain sponge on top of the drain sponge that is on the wound. Make sure the tube is between those layers. °7. Tape the dressing to your skin. °8. If you have an active bulb or Hemovac drain, tape the drainage tube to your skin 1-2 inches (2.5-5 cm) below the place where the tube enters your body. Taping keeps the tube from pulling on any stitches (sutures) that you have. °9. Wash your hands with soap and water. °10. Write down the color of your drainage and how often you change your dressing. ° °How to empty your active bulb or Hemovac drain °1. Make sure that you have a measuring cup that you can empty your drainage into. °2. Wash your hands with soap and water. If soap and water are not available, use hand sanitizer. °3. Gently move your fingers down the tube while squeezing very lightly. This is called stripping the tube. This clears any drainage, clots, or tissue from the tube. °? Do not pull on the tube. °? You may need to strip   the tube several times every day to keep the tube clear. °4. Open the bulb cap or the drain plug. Do not touch the inside of the cap or the bottom of the plug. °5. Empty all of the drainage into the measuring cup. °6. Compress the bulb or the container and replace the cap or the plug. To compress the bulb or the container, squeeze it firmly in the middle while you close the cap or plug the container. °7. Write down the amount of drainage that you have in each 24-hour period. If you have less than 2 Tbsp (30 mL) of drainage during 24 hours, contact your health care  provider. °8. Flush the drainage down the toilet. °9. Wash your hands with soap and water. °Contact a health care provider if: °· You have more redness, swelling, or pain around your drain area. °· The amount of drainage that you have is increasing instead of decreasing. °· You have pus or a bad smell coming from your drain area. °· You have a fever. °· You have drainage that is cloudy. °· There is a sudden stop or a sudden decrease in the amount of drainage that you have. °· Your tube falls out. °· Your active drain does not stay compressed after you empty it. °This information is not intended to replace advice given to you by your health care provider. Make sure you discuss any questions you have with your health care provider. °Document Released: 12/04/2000 Document Revised: 05/14/2016 Document Reviewed: 06/26/2015 °Elsevier Interactive Patient Education © 2018 [employer] ° ° °[facility]___[facility] °[phone] ° °MASTECTOMY: POST OP INSTRUCTIONS ° °Always review your discharge instruction sheet given to you by the facility where your surgery was performed. °IF YOU HAVE DISABILITY OR FAMILY LEAVE FORMS, YOU MUST BRING THEM TO THE OFFICE FOR PROCESSING.   °DO NOT GIVE THEM TO YOUR DOCTOR. °A prescription for pain medication may be given to you upon discharge.  Take your pain medication as prescribed, if needed.  If narcotic pain medicine is not needed, then you may take acetaminophen (Tylenol) or ibuprofen (Advil) as needed. °1. Take your usually prescribed medications unless otherwise directed. °2. If you need a refill on your pain medication, please contact your pharmacy.  They will contact our office to request authorization.  Prescriptions will not be filled after 5pm or on week-ends. °3. You should follow a light diet the first few days after arrival home, such as soup and crackers, etc.  Resume your normal diet the day after surgery. °4. Most patients will experience some swelling and  bruising on the chest and underarm.  Ice packs will help.  Swelling and bruising can take several days to resolve.  °5. It is common to experience some constipation if taking pain medication after surgery.  Increasing fluid intake and taking a stool softener (such as Colace) will usually help or prevent this problem from occurring.  A mild laxative (Milk of Magnesia or Miralax) should be taken according to package instructions if there are no bowel movements after 48 hours. °6. Unless discharge instructions indicate otherwise, leave your bandage dry and in place until your next appointment in 3-5 days.  You may take a limited sponge bath.  No tube baths or showers until the drains are removed.  You may have steri-strips (small skin tapes) in place directly over the incision.  These strips should be left on the skin for 7-10 days.  If your surgeon used skin glue on the incision, you may   shower in 24 hours.  The glue will flake off over the next 2-3 weeks.  Any sutures or staples will be removed at the office during your follow-up visit. °7. DRAINS:  If you have drains in place, it is important to keep a list of the amount of drainage produced each day in your drains.  Before leaving the hospital, you should be instructed on drain care.  Call our office if you have any questions about your drains. °8. ACTIVITIES:  You may resume regular (light) daily activities beginning the next day--such as daily self-care, walking, climbing stairs--gradually increasing activities as tolerated.  You may have sexual intercourse when it is comfortable.  Refrain from any heavy lifting or straining until approved by your doctor. °a. You may drive when you are no longer taking prescription pain medication, you can comfortably wear a seatbelt, and you can safely maneuver your car and apply brakes. °b. RETURN TO WORK:  __________________________________________________________ °9. You should see your doctor in the office for a follow-up  appointment approximately 3-5 days after your surgery.  Your doctor’s nurse will typically make your follow-up appointment when she calls you with your pathology report.  Expect your pathology report 2-3 business days after your surgery.  You may call to check if you do not hear from us after three days.   °10. OTHER INSTRUCTIONS: ______________________________________________________________________________________________ ____________________________________________________________________________________________ °WHEN TO CALL YOUR DOCTOR: °1. Fever over 101.0 °2. Nausea and/or vomiting °3. Extreme swelling or bruising °4. Continued bleeding from incision. °5. Increased pain, redness, or drainage from the incision. °The clinic staff is available to answer your questions during regular business hours.  Please don’t hesitate to call and ask to speak to one of the nurses for clinical concerns.  If you have a medical emergency, go to the nearest emergency room or call 911.  A surgeon from [facility] is always on call at the hospital. °[address], [address], [place], [place]  [address] ? [address] [address], [place], [place]   [address] °[phone] ? [phone] ? FAX [phone] ° °

## 2017-10-08 NOTE — Progress Notes (Signed)
  POD#1 bilateral NSM right SLN TE/ADM reconstruction  Temp:  [97.5 F (36.4 C)-98.6 F (37 C)] 98.2 F (36.8 C) (10/19 0537) Pulse Rate:  [61-95] 65 (10/19 0537) Resp:  [6-23] 18 (10/19 0537) BP: (85-115)/(51-78) 106/67 (10/19 0537) SpO2:  [96 %-100 %] 99 % (10/19 0537) Weight:  [55.4 kg (122 lb 1.6 oz)-58.5 kg (128 lb 14.4 oz)] 55.4 kg (122 lb 1.6 oz) (10/18 2035)   Had episode retention, has been able to void.  Not much pain took muscle relaxant tol diet  PE: chest soft incisions dry intact some developing ecchymoses left Drains serosanguinous  A/P Anticipate home today. All Rx written, will await evaluation by Dr. Barry Dienes. Ambulate.  Irene Limbo, MD [facility] [phone], pin [phone]

## 2017-10-08 NOTE — Progress Notes (Signed)
Discharge home. Home discharge instruction given, no question verbalized. 

## 2017-10-12 ENCOUNTER — Telehealth: Payer: Self-pay | Admitting: *Deleted

## 2017-10-12 NOTE — Telephone Encounter (Signed)
Received order for oncotype testing. Requisition sent to pathology. Received by Lynelle Smoke

## 2017-10-13 ENCOUNTER — Encounter (HOSPITAL_COMMUNITY): Payer: Self-pay | Admitting: General Surgery

## 2017-10-14 ENCOUNTER — Telehealth: Payer: Self-pay

## 2017-10-14 NOTE — Telephone Encounter (Signed)
Called patient regarding call to after hours number last pm. Complaining dizzy feeling and pinch in her incisions. Called and instructed her to call her surgeon. Verbalized understanding.

## 2017-10-15 ENCOUNTER — Telehealth: Payer: Self-pay | Admitting: General Surgery

## 2017-10-15 ENCOUNTER — Encounter (HOSPITAL_COMMUNITY): Payer: Self-pay | Admitting: General Surgery

## 2017-10-15 NOTE — Telephone Encounter (Signed)
Left message on machine at cell phone at home phone.  Asked path to review to make sure posterior margin is one involved and to check on subareolar tissue.  One micromet in LN.

## 2017-10-19 NOTE — Discharge Summary (Signed)
Physician Discharge Summary  Patient ID: Dawn Ramos MRN: [id number] DOB/AGE: 1976-09-26 41 y.o.  Admit date: 10/07/2017 Discharge date: 10/08/2017  Admission Diagnoses: Patient Active Problem List   Diagnosis Date Noted  . Breast cancer, right (HCC) 10/07/2017  . Genetic testing 08/30/2017  . Monoallelic mutation of CHEK2 gene in female patient 08/30/2017  . Malignant neoplasm of upper-outer quadrant of right breast in female, estrogen receptor positive (HCC) 08/17/2017    Discharge Diagnoses:  Active Problems:   Breast cancer, right (HCC) monoallelic mutation of CHEK2  Discharged Condition: stable  Hospital Course:  Pt was admitted to the floor following bilateral nipple sparing mastectomy and right sentinel lymph node biopsy combined with expander reconstruction with Dr. Leta Baptist.  She did well with good pain control.  She denied nausea/vomiting.  Drain output was serosanguinous.  She and her family demonstrated ability to work with drains.  The skin was intact.  The patient was discharged in stable condition on POD1.     Consults: with Dr. Leta Baptist  Significant Diagnostic Studies: labs: 40.1 prior to d/c  Treatments: surgery: see above.    Discharge Exam: Blood pressure 106/67, pulse 65, temperature 98.2 F (36.8 C), temperature source Oral, resp. rate 18, height 5\' 7"  (1.702 m), weight 55.4 kg (122 lb 1.6 oz), last menstrual period 09/15/2017, SpO2 99 %. General appearance: alert, cooperative and no distress Breasts: skin looks intact without evidence of ischemia GI: soft Extremities: extremities normal, atraumatic, no cyanosis or edema  Disposition: 01-Home or Self Care  Discharge Instructions    Call MD for:  persistant dizziness or light-headedness    Complete by:  As directed    Call MD for:  persistant nausea and vomiting    Complete by:  As directed    Call MD for:  redness, tenderness, or signs of infection (pain, swelling, bleeding, redness,  odor or green/yellow discharge around incision site)    Complete by:  As directed    Call MD for:  redness, tenderness, or signs of infection (pain, swelling, redness, odor or green/yellow discharge around incision site)    Complete by:  As directed    Call MD for:  severe uncontrolled pain    Complete by:  As directed    Call MD for:  temperature >100.4    Complete by:  As directed    Call MD for:  temperature >100.5    Complete by:  As directed    Change dressing (specify)    Complete by:  As directed    Measure and record drain output.  Take record of drain output to clinic with Dr. 09/17/2017.   Diet - low sodium heart healthy    Complete by:  As directed    Discharge instructions    Complete by:  As directed    Ok to remove dressings and shower am 10.20.18. Soap and water ok, pat incisions dry. Pat tegaderms dry. Do no remove Tegaderms. No creams or ointments over incisions. Do not let drains dangle in shower, attach to lanyard or similar.Strip and record drains twice daily and bring log to clinic visit.  Breast binder or soft compression bra all other times.  Ok to raise arms above shoulders for bathing and dressing.  No house yard work or exercise until cleared by MD. Leta Baptist Miralax as needed for constipation. Ok to use ibuprofen and/or Tylenol as needed to help with pain.   Driving Restrictions    Complete by:  As directed    No driving  for 2 weeks   Increase activity slowly    Complete by:  As directed    Lifting restrictions    Complete by:  As directed    No lifting > 5 lbs until cleared by MD   Resume previous diet    Complete by:  As directed      Allergies as of 10/08/2017      Reactions   Macrobid [nitrofurantoin Macrocrystal] Swelling, Rash   Bee Venom Swelling      Medication List    TAKE these medications   carbamazepine 100 MG 12 hr capsule Commonly known as:  CARBATROL Take 200 mg by mouth at bedtime.   DONG QUAI PO Take 1,100 mg by mouth 2 (two)  times daily as needed (menstral stress).   escitalopram 10 MG tablet Commonly known as:  LEXAPRO Take 10 mg by mouth at bedtime.   ibuprofen 200 MG tablet Commonly known as:  ADVIL,MOTRIN Take 400 mg by mouth every 8 (eight) hours as needed for cramping.   lisdexamfetamine 50 MG capsule Commonly known as:  VYVANSE Take 50 mg by mouth daily.   methocarbamol 500 MG tablet Commonly known as:  ROBAXIN Take 1 tablet (500 mg total) by mouth every 8 (eight) hours as needed for muscle spasms.   multivitamin tablet Take 1 tablet by mouth daily.   oxyCODONE 5 MG immediate release tablet Commonly known as:  Oxy IR/ROXICODONE Take 1-2 tablets (5-10 mg total) by mouth every 4 (four) hours as needed for moderate pain.   sulfamethoxazole-trimethoprim 800-160 MG tablet Commonly known as:  BACTRIM DS,SEPTRA DS Take 1 tablet by mouth 2 (two) times daily.   vitamin C 500 MG tablet Commonly known as:  ASCORBIC ACID Take 500 mg by mouth daily.            Discharge Care Instructions        Start     Ordered   10/08/17 0000  Change dressing (specify)    Comments:  Measure and record drain output.  Take record of drain output to clinic with Dr. Iran Planas.   10/08/17 0858     Follow-up Information    Irene Limbo, MD Follow up in 1 week(s).   Specialty:  Plastic Surgery Why:  as scheduled Contact information: [address] [place] [address] [phone]        Stark Klein, MD. Schedule an appointment as soon as possible for a visit in 2 week(s).   Specialty:  General Surgery Contact information: [address] [place] [address] [phone]           Signed: Stark Klein 10/19/2017, 2:36 PM

## 2017-10-20 ENCOUNTER — Telehealth: Payer: Self-pay | Admitting: *Deleted

## 2017-10-20 ENCOUNTER — Telehealth: Payer: Self-pay | Admitting: Hematology

## 2017-10-20 ENCOUNTER — Encounter (HOSPITAL_COMMUNITY): Payer: Self-pay | Admitting: General Surgery

## 2017-10-20 ENCOUNTER — Encounter: Payer: Self-pay | Admitting: *Deleted

## 2017-10-20 NOTE — Telephone Encounter (Signed)
Received oncotype score of 18/12%. Physician team notified.

## 2017-10-20 NOTE — Telephone Encounter (Signed)
Spoke with patient regarding appts that were scheduled per 10/31 sch msg.

## 2017-10-21 ENCOUNTER — Encounter (HOSPITAL_COMMUNITY): Payer: Self-pay | Admitting: *Deleted

## 2017-10-21 ENCOUNTER — Encounter (HOSPITAL_COMMUNITY): Payer: Self-pay

## 2017-10-21 ENCOUNTER — Encounter: Payer: Self-pay | Admitting: Hematology

## 2017-10-21 ENCOUNTER — Ambulatory Visit (HOSPITAL_BASED_OUTPATIENT_CLINIC_OR_DEPARTMENT_OTHER): Payer: BLUE CROSS/BLUE SHIELD | Admitting: Hematology

## 2017-10-21 VITALS — BP 112/70 | HR 97 | Temp 98.4°F | Resp 18 | Ht 67.0 in | Wt 127.1 lb

## 2017-10-21 DIAGNOSIS — C50411 Malignant neoplasm of upper-outer quadrant of right female breast: Secondary | ICD-10-CM | POA: Diagnosis not present

## 2017-10-21 DIAGNOSIS — Z1501 Genetic susceptibility to malignant neoplasm of breast: Secondary | ICD-10-CM

## 2017-10-21 DIAGNOSIS — Z1502 Genetic susceptibility to malignant neoplasm of ovary: Secondary | ICD-10-CM

## 2017-10-21 DIAGNOSIS — Z1589 Genetic susceptibility to other disease: Secondary | ICD-10-CM

## 2017-10-21 DIAGNOSIS — Z170 Estrogen receptor positive status [ER+]: Secondary | ICD-10-CM | POA: Diagnosis not present

## 2017-10-21 DIAGNOSIS — Z1509 Genetic susceptibility to other malignant neoplasm: Secondary | ICD-10-CM

## 2017-10-21 NOTE — Progress Notes (Addendum)
[place]  [phone] [phone]  Clinic Follow up Note   Patient Care Team: Patient, No Pcp Per as PCP - General ([place]) Truitt Merle, MD as Consulting Physician (Hematology) Stark Klein, MD as Consulting Physician (General Surgery) Kyung Rudd, MD as Consulting Physician (Radiation Oncology) 10/21/2017  CHIEF COMPLAINT: f/u right breast cancer   SUMMARY OF ONCOLOGIC HISTORY: Oncology History   Cancer Staging Malignant neoplasm of upper-outer quadrant of right breast in female, estrogen receptor positive ([place]) Staging form: Breast, AJCC 8th Edition - Clinical stage from 08/11/2017: Stage IB (cT2, cN0, cM0, G2, ER: Positive, PR: Positive, HER2: Negative) - Signed by Truitt Merle, MD on 08/17/2017       Malignant neoplasm of upper-outer quadrant of right breast in female, estrogen receptor positive ([place])   08/06/2017 Mammogram    IMPRESSION: 1. Palpable 2.1 cm mass in the 11 o'clock retroareolar right breast is highly suspicious for malignancy. 2. 1.3 cm markedly hypoechoic lymph node in the 10 o'clock position of the right breast axillary tail is indeterminate. Involvement with metastatic carcinoma cannot be excluded. 3. No suspicious lymph nodes are identified within the right axilla. 4. Extremely dense breast parenchyma. If breast biopsy is positive for malignancy, breast MRI is suggested. 5. No evidence of malignancy in the left breast.       08/11/2017 Receptors her2    Estrogen Receptor: 100%, POSITIVE, STRONG STAINING INTENSITY Progesterone Receptor: 100%, POSITIVE, STRONG STAINING INTENSITY Proliferation Marker Ki67: 10% HER2 NEGATIVE       08/11/2017 Initial Biopsy    Diagnosis 1. Breast, right, needle core biopsy, 11:00 o'clock - INVASIVE AND IN SITU LOBULAR CARCINOMA. - SEE COMMENT. 2. Breast, right, needle core biopsy, 10:00 o'clock lymph node - FIBROCYSTIC CHANGES. - LYMPH NODAL TISSUE IS NOT IDENTIFIED. -  THERE IS NO EVIDENCE OF MALIGNANCY.      08/17/2017 Initial Diagnosis    Malignant neoplasm of upper-outer quadrant of right breast in female, estrogen receptor positive ([place])     08/29/2017 Genetic Testing    Dawn Ramos underwent genetic counseling and testing for hereditary cancer syndromes on 08/19/2017. 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   She was tested for 53 genes on the common hereditary cancer panel and the melanoma panel.  The Hereditary Gene Panel offered by [facility] includes sequencing and/or deletion duplication testing of the following 46 genes: APC, ATM, AXIN2, BARD1, BMPR1A, BRCA1, BRCA2, BRIP1, CDH1, CDKN2A (p14ARF), CDKN2A (p16INK4a), CHEK2, CTNNA1, DICER1, EPCAM (Deletion/duplication testing only), GREM1 (promoter region deletion/duplication testing only), KIT, MEN1, MLH1, MSH2, MSH3, MSH6, MUTYH, NBN, NF1, NHTL1, PALB2, PDGFRA, PMS2, POLD1, POLE, PTEN, RAD50, RAD51C, RAD51D, SDHB, SDHC, SDHD, SMAD4, SMARCA4. STK11, TP53, TSC1, TSC2, and VHL.  The following genes were evaluated for sequence changes only: SDHA and HOXB13 c.251G>A variant only.  The Melanoma panel offered by [facility] includes sequencing and/or deletion duplication testing of the following 12 genes: BAP1, BRCA1, BRCA2, BRIP1, CDK4, CDKN2A (p14ARF), CDKN2A (p16INK4a), MC1R, POT1, PTEN, RB1, TERT, and TP53.  The following gene was evaluated for sequence changes only: MITF (c.952G>A, p.GLU318Lys variant only). The report date is September 02, 2017.       10/07/2017 Surgery    1. Breast, simple mastectomy, Left - PSEUDOANGIOMATOUS STROMAL HYPERPLASIA ([place]). - FIBROCYSTIC CHANGE. - USUAL DUCTAL HYPERPLASIA. - NO MALIGNANCY IDENTIFIED. 2. Lymph node, sentinel, biopsy, Right Axillary #1 - FIBROADENOMA. - NO LYMPHOID TISSUE. - NO MALIGNANCY IDENTIFIED. 3. Lymph node, sentinel, biopsy, Right - ONE OF ONE  LYMPH NODES IS NEGATIVE FOR CARCINOMA (0/1). 4. Lymph node,  sentinel, biopsy, Right - ONE OF ONE LYMPH NODES IS NEGATIVE FOR CARCINOMA (0/1). 5. Lymph node, sentinel, biopsy, Right - MICROMETASTASIS IN ONE OF ONE LYMPH NODES (1/1). 6. Lymph node, sentinel, biopsy, Right - ONE OF ONE LYMPH NODES IS NEGATIVE FOR CARCINOMA (0/1). 7. Lymph node, sentinel, biopsy, Right Axillary #2 - ONE OF ONE LYMPH NODES IS NEGATIVE FOR CARCINOMA (0/1). 8. Lymph node, sentinel, biopsy, Right - ONE OF ONE LYMPH NODES IS NEGATIVE FOR CARCINOMA (0/1). 9. Lymph node, sentinel, biopsy, Right Axillary #3 - ONE OF ONE LYMPH NODES IS NEGATIVE FOR CARCINOMA (0/1). 10. Lymph node, sentinel, biopsy, Right Axillary #4 - ONE OF ONE LYMPH NODES IS NEGATIVE FOR CARCINOMA (0/1). 11. Lymph node, sentinel, biopsy, Right Axillary #5 - ONE OF ONE LYMPH NODES IS NEGATIVE FOR CARCINOMA (0/1). 12. Lymph node, sentinel, biopsy, Right - ONE OF ONE LYMPH NODES IS NEGATIVE FOR CARCINOMA (0/1). 13. Breast, simple mastectomy, Right - INVASIVE LOBULAR CARCINOMA, GRADE 2, SPANNING 3.0 CM. - LOBULAR NEOPLASIA (LOBULAR CARCINOMA IN SITU). - INVASIVE CARCINOMA IS BROADLY PRESENT AT THE POSTERIOR MARGIN. - SEE ONCOLOGY TABLE.  Oncotype Recurrence score: 18     CURRENT THERAPY: PENDING adjuvant tamoxifen and ovarian suppression; possible radiation   INTERVAL HISTORY: Dawn Ramos returns to discuss surgical findings and oncotype score. She had bilateral nipple sparing mastectomy with Dr. Barry Dienes with immediate reconstruction with tissue expanders per Dr. Iran Planas on 10/07/17. She presents today with left breast wound and drainage tube, continues to drain serosanguinous 30 cc/day. Completed bactrim. She will undergo wound debridement of left mastectomy flap tomorrow. She has "pinching" sensation under the left breast, pain is well managed with oxycodone at night. Has limited upper extremity ROM. Denies fever, chills, dyspnea.   REVIEW OF SYSTEMS:   Constitutional: Denies fevers, chills or abnormal  weight loss. Good appetie Eyes: Denies blurriness of vision Ears, nose, mouth, throat, and face: Denies mucositis or sore throat Respiratory: Denies cough, dyspnea or wheezes Cardiovascular: Denies palpitation, chest discomfort or lower extremity swelling Gastrointestinal:  Denies nausea, vomiting, constipation, diarrhea, heartburn or change in bowel habits Skin: Denies abnormal skin rashes Lymphatics: Denies new lymphadenopathy or easy bruising Neurological:Denies numbness, tingling or new weaknesses Behavioral/Psych: Mood is stable, no new changes (+) bipolar, stable  Breasts: (+) s/p bilateral mastectomy with immediate reconstruction with tissue expanders; left breast wound, drainage tube in place  All other systems were reviewed with the patient and are negative.  MEDICAL HISTORY:  Past Medical History:  Diagnosis Date  . ADHD   . Bipolar 1 disorder ([place])   . Depression     SURGICAL HISTORY: Past Surgical History:  Procedure Laterality Date  . BREAST RECONSTRUCTION WITH PLACEMENT OF TISSUE EXPANDER AND FLEX HD (ACELLULAR HYDRATED DERMIS) Bilateral 10/07/2017   Procedure: BREAST RECONSTRUCTION WITH PLACEMENT OF TISSUE EXPANDER AND ALLODERM;  Surgeon: Irene Limbo, MD;  Location: [facility];  Service: Plastics;  Laterality: Bilateral;  . NIPPLE SPARING MASTECTOMY/SENTINAL LYMPH NODE BIOPSY/RECONSTRUCTION/PLACEMENT OF TISSUE EXPANDER Bilateral 10/07/2017   Procedure: BILATERAL NIPPLE SPARING MASTECTOMY WITH RIGHT SENTINEL LYMPH NODE BIOPSY;  Surgeon: Stark Klein, MD;  Location: [facility];  Service: General;  Laterality: Bilateral;  . ROOT CANAL     3  . WRIST SURGERY Left     I have reviewed the social history and family history with the patient and they are unchanged from previous note.  ALLERGIES:  is allergic to macrobid [nitrofurantoin macrocrystal] and bee venom.  MEDICATIONS:  Current Outpatient Prescriptions  Medication Sig Dispense Refill  . carbamazepine (CARBATROL)  100 MG 12 hr capsule Take 200 mg by mouth at bedtime.    Marland Kitchen escitalopram (LEXAPRO) 10 MG tablet Take 10 mg by mouth at bedtime.     Marland Kitchen ibuprofen (ADVIL,MOTRIN) 200 MG tablet Take 400 mg by mouth every 8 (eight) hours as needed for cramping.    . lisdexamfetamine (VYVANSE) 50 MG capsule Take 50 mg by mouth daily.    . Magnesium Gluconate 550 MG TABS Take 30 mg by mouth daily.    . Multiple Vitamin (MULTIVITAMIN) tablet Take 1 tablet by mouth daily.    Marland Kitchen oxyCODONE (OXY IR/ROXICODONE) 5 MG immediate release tablet Take 1-2 tablets (5-10 mg total) by mouth every 4 (four) hours as needed for moderate pain. 40 tablet 0  . vitamin C (ASCORBIC ACID) 500 MG tablet Take 500 mg by mouth daily.     No current facility-administered medications for this visit.     PHYSICAL EXAMINATION: ECOG PERFORMANCE STATUS: 1 - Symptomatic but completely ambulatory  Vitals:   10/21/17 1514  BP: 112/70  Pulse: 97  Resp: 18  Temp: 98.4 F (36.9 C)  SpO2: 100%   Filed Weights   10/21/17 1514  Weight: 127 lb 1.6 oz (57.7 kg)    GENERAL:alert, no distress and comfortable SKIN: skin color, texture, turgor are normal, no rashes  EYES: normal, Conjunctiva are pink and non-injected, sclera clear LYMPH:  no palpable cervical, supraclavicular, or axillary lymphadenopathy  LUNGS: clear to auscultation bilaterally with normal breathing effort HEART: regular rate & rhythm and no murmurs and no lower extremity edema ABDOMEN:abdomen soft, non-tender and normal bowel sounds Musculoskeletal:no cyanosis of digits and no clubbing  NEURO: alert & oriented x 3 with fluent speech, no focal motor/sensory deficits BREASTS: inspection shows them to be s/p bilateral nipple sparing mastectomy with reconstruction with tissue expanders, symmetrical. Right breast healing well. Left breast with approx 7x3 cm area of ischemic wound changes including the nipple and areola, smaller area of dark eschar over left outer lower region of wound.  Drainage tube intact, serosanguinous output    LABORATORY DATA:  I have reviewed the data as listed CBC Latest Ref Rng & Units 09/30/2017 08/18/2017  WBC 4.0 - 10.5 K/uL 5.7 4.0  Hemoglobin 12.0 - 15.0 g/dL 13.5 13.7  Hematocrit 36.0 - 46.0 % 40.1 40.7  Platelets 150 - 400 K/uL 222 232     CMP Latest Ref Rng & Units 09/30/2017 08/18/2017  Glucose 65 - 99 mg/dL 96 92  BUN 6 - 20 mg/dL 18 19.5  Creatinine 0.44 - 1.00 mg/dL 0.74 0.8  Sodium 135 - 145 mmol/L 137 142  Potassium 3.5 - 5.1 mmol/L 4.2 4.4  Chloride 101 - 111 mmol/L 100(L) -  CO2 22 - 32 mmol/L 27 28  Calcium 8.9 - 10.3 mg/dL 9.4 9.4  Total Protein 6.5 - 8.1 g/dL 6.7 6.9  Total Bilirubin 0.3 - 1.2 mg/dL 0.7 0.48  Alkaline Phos 38 - 126 U/L 47 51  AST 15 - 41 U/L 24 18  ALT 14 - 54 U/L 18 18   Diagnosis 10/07/17 1. Breast, simple mastectomy, Left - PSEUDOANGIOMATOUS STROMAL HYPERPLASIA ([place]). - FIBROCYSTIC CHANGE. - USUAL DUCTAL HYPERPLASIA. - NO MALIGNANCY IDENTIFIED. 2. Lymph node, sentinel, biopsy, Right Axillary #1 - FIBROADENOMA. - NO LYMPHOID TISSUE. - NO MALIGNANCY IDENTIFIED. 3. Lymph node, sentinel, biopsy, Right - ONE OF ONE LYMPH NODES IS NEGATIVE FOR CARCINOMA (0/1). 4. Lymph node,  sentinel, biopsy, Right - ONE OF ONE LYMPH NODES IS NEGATIVE FOR CARCINOMA (0/1). 5. Lymph node, sentinel, biopsy, Right - MICROMETASTASIS IN ONE OF ONE LYMPH NODES (1/1). 6. Lymph node, sentinel, biopsy, Right - ONE OF ONE LYMPH NODES IS NEGATIVE FOR CARCINOMA (0/1). 7. Lymph node, sentinel, biopsy, Right Axillary #2 - ONE OF ONE LYMPH NODES IS NEGATIVE FOR CARCINOMA (0/1). 8. Lymph node, sentinel, biopsy, Right - ONE OF ONE LYMPH NODES IS NEGATIVE FOR CARCINOMA (0/1). 9. Lymph node, sentinel, biopsy, Right Axillary #3 - ONE OF ONE LYMPH NODES IS NEGATIVE FOR CARCINOMA (0/1). 10. Lymph node, sentinel, biopsy, Right Axillary #4 - ONE OF ONE LYMPH NODES IS NEGATIVE FOR CARCINOMA (0/1). 11. Lymph node, sentinel, biopsy,  Right Axillary #5 - ONE OF ONE LYMPH NODES IS NEGATIVE FOR CARCINOMA (0/1). 12. Lymph node, sentinel, biopsy, Right - ONE OF ONE LYMPH NODES IS NEGATIVE FOR CARCINOMA (0/1). 13. Breast, simple mastectomy, Right - INVASIVE LOBULAR CARCINOMA, GRADE 2, SPANNING 3.0 CM. - LOBULAR NEOPLASIA (LOBULAR CARCINOMA IN SITU). - INVASIVE CARCINOMA IS BROADLY PRESENT AT THE POSTERIOR MARGIN. - SEE ONCOLOGY TABLE.  ONCOTYPE RECURRENCE SCORE: 18   RADIOGRAPHIC STUDIES: I have personally reviewed the radiological images as listed and agreed with the findings in the report. No results found.   ASSESSMENT & PLAN: Dawn Ramos is a 41 y.o. female with a history of ADHD and Bipolar 1 disorders.   1. Malignant neoplasm of upper-outer quadrant of right breast, invasive lobular carcinoma, pT2N66mM0, Stage IB, ER100%+, PR100%+, HER2: Negative, Grade 2, (+) LCIS  2. 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 Smoking cessation 4. Bipolar  5. Left breast wound ischemic changes, requiring debridement   Ms. LWorthpresents with left breast wound requiring debridement of mastectomy flap on 10/22/17 per Dr. TIran Planas Possibility that skin may not be able to be closed over expander and will need to remove left expander altogether for future reconstruction.   We reviewed surgical pathology and oncotype recurrence score in detail. RS is 18, intermediate risk. Considering this and that her tumor is lobular histology and less sensitive to chemotherapy, Dr. FBurr Medicois not recommending adjuvant chemo.   Her posterior surgical margin was positive for invasive carcinoma and she has micrometastasis in 1 SLN, we have referred her back to Dr. MLisbeth Renshawfor further consideration of adjuvant radiation.   Her tumor is strongly ER/PR positive, and therefore she is a candidate for receive adjuvant endocrine therapy. She is worried about effects of estrogen level on her bipolar/mood. She did not respond well to hormone contraceptive;  however, she may tolerate estrogen depletion without difficulty, but we will start her at 10 mg tamoxifen once her wound heals. Will start endocrine therapy with ovarian suppression pending radiation therapy. F/u after radiation therapy, if no rad onc, will see her few weeks after plastic surgery.   PLAN: -proceed with wound debridement 11/2 per Dr. TIran Planas-referred back to Dr. MLisbeth Renshaw-F/U and begin tamoxifen at 10 mg daily with ovarian suppression after rad onc and/or wound has healed -f/u appointment open for now, plan to see her back after she completes radiation    All questions were answered. The patient knows to call the clinic with any problems, questions or concerns. No barriers to learning was detected.    Dawn Feeling NP 10/21/17   Addendum I have seen the patient, examined her. I agree with the assessment and and plan and have edited the notes.   In addition, I have reviewed the  recently published TAILOR X trial data.  The trial overall showed no benefit of additional adjuvant chemotherapy to endocrine therapy for patients with node-negative, ER or PR positive, HER-2 negative breast cancer if their oncotype RS 11-25.  However there is a small benefit from chemotherapy for young woman with RS 16-25, the risk of recurrence is 2-3% less in the chemo group.  I have discussed this with patient in detail, given her relatively low recurrence score, lobular histology, I do not recommend adjuvant chemotherapy.  Meantime, I strongly recommend adjuvant antiestrogen therapy, especially ovarian suppression plus aromatase inhibitor.  However patient had huge concern of her tolerance to antacid therapy.  We had a long conversation, and we will start her on low-dose tamoxifen first, after her radiation, to see how she tolerates.  I spent 30 minutes counseling the patient face to face. The total time spent in the appointment was 40 minutes and more than 50% was on counseling and review of test  results  Truitt Merle  10/21/2017

## 2017-10-21 NOTE — H&P (Signed)
Patient ID: Dawn Ramos is a 41 y.o. female.  HPI 2 weeks post op.  Drains R 30/34/20 L 28/27/20   Presented with palpable mass. This was first MMG. MMG with distortion UOQ right 2 cm. US with mass 11 o clock 2.1 x 1.5 x 1.1 cm, deep margin of the mass adjacent to the pectoralis muscle. A LN 1.3 cm imaged in the axillary tail of the right breast at 10 o'clock position; US axilla negative. Biopsy of mass with ILC, ER/PR+, Her2 -. Biopsy of 10 o clock LN with fibrocystic changes, no lymph tissue.MRI with known 2.1 cm mass UOQ and NME involving the upper inner quadrant. 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 Per breast conference recommendations, she will be offered adjuvant radiation.  Oncotype 18/12%. Has visit with Dr. Feng today.  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   Prior 34C, happy with this. Right mastectomy 316 g Left 440 g  Prior 0.5 ppd- states last cig was her consult visit date. Notes husband has also quit.  PMH significant for bipolar d/o.  Lives with husband. Works for non profit. Avid biker.  Review of Systems     Objective:   Physical Exam  Cardiovascular: Normal rate, regular rhythm and normal heart sounds.   Pulmonary/Chest: Effort normal and breath sounds normal.   Chest:  right soft, healing well  left with 8 x 3 cm area ischemic changes including NAC, dark eschar over lower outer pole suggesting full thickness in this are, UOW with eschar over area of excised burn flap Drains serosanguinous     Assessment:     Right breast ca UOQ ER+ Chek2 mutation S/p bilateral NSM, TE/ADM (Alloderm, prepectoral) reconstruction    Plan:     Drain right removed.  Plan debridement of left mastectomy flap. This is large area and I suspect with will need to resect NAC with vertical scar central breast and if I am unable  to close skin over expander without tension will need to remove expander and plan delayed reconstruction. Pictures taken. Will leave left drain in place until time of surgery. Plan saline exchange of air in expander on right during this procedure.     Natrelle [id number]-12-T 400 ml tissue expanders placed bilateral, initial fill 300 ml air       Brinda Thimmappa, MD MBA Plastic & Reconstructive Surgery [phone], pin [phone]   

## 2017-10-22 ENCOUNTER — Ambulatory Visit (HOSPITAL_BASED_OUTPATIENT_CLINIC_OR_DEPARTMENT_OTHER): Payer: BLUE CROSS/BLUE SHIELD | Admitting: Anesthesiology

## 2017-10-22 ENCOUNTER — Ambulatory Visit (HOSPITAL_COMMUNITY)
Admission: RE | Admit: 2017-10-22 | Discharge: 2017-10-22 | Disposition: A | Discharge status: Home / Self Care | Payer: BLUE CROSS/BLUE SHIELD | Source: Ambulatory Visit | Attending: Plastic Surgery | Admitting: Plastic Surgery

## 2017-10-22 ENCOUNTER — Encounter (HOSPITAL_COMMUNITY)
Admission: RE | Disposition: A | Discharge status: Home / Self Care | Payer: Self-pay | Source: Ambulatory Visit | Attending: Plastic Surgery

## 2017-10-22 ENCOUNTER — Encounter (HOSPITAL_COMMUNITY): Payer: Self-pay | Admitting: Anesthesiology

## 2017-10-22 ENCOUNTER — Telehealth: Payer: Self-pay | Admitting: Hematology

## 2017-10-22 ENCOUNTER — Other Ambulatory Visit: Payer: Self-pay | Admitting: *Deleted

## 2017-10-22 DIAGNOSIS — C50912 Malignant neoplasm of unspecified site of left female breast: Secondary | ICD-10-CM | POA: Insufficient documentation

## 2017-10-22 DIAGNOSIS — Z170 Estrogen receptor positive status [ER+]: Secondary | ICD-10-CM | POA: Insufficient documentation

## 2017-10-22 DIAGNOSIS — C50411 Malignant neoplasm of upper-outer quadrant of right female breast: Secondary | ICD-10-CM | POA: Insufficient documentation

## 2017-10-22 DIAGNOSIS — F319 Bipolar disorder, unspecified: Secondary | ICD-10-CM | POA: Insufficient documentation

## 2017-10-22 DIAGNOSIS — T86821 Skin graft (allograft) (autograft) failure: Secondary | ICD-10-CM | POA: Insufficient documentation

## 2017-10-22 DIAGNOSIS — Z9013 Acquired absence of bilateral breasts and nipples: Secondary | ICD-10-CM | POA: Insufficient documentation

## 2017-10-22 DIAGNOSIS — Z87891 Personal history of nicotine dependence: Secondary | ICD-10-CM | POA: Diagnosis not present

## 2017-10-22 DIAGNOSIS — Y838 Other surgical procedures as the cause of abnormal reaction of the patient, or of later complication, without mention of misadventure at the time of the procedure: Secondary | ICD-10-CM | POA: Diagnosis not present

## 2017-10-22 HISTORY — PX: INCISION AND DRAINAGE OF WOUND: SHX1803

## 2017-10-22 SURGERY — IRRIGATION AND DEBRIDEMENT WOUND
Anesthesia: General | Site: Breast | Laterality: Left

## 2017-10-22 MED ORDER — PROPOFOL 10 MG/ML IV BOLUS
INTRAVENOUS | Status: AC
  Filled 2017-10-22: qty 20

## 2017-10-22 MED ORDER — CHLORHEXIDINE GLUCONATE CLOTH 2 % EX PADS: 6.0000 | MEDICATED_PAD | Freq: Once | CUTANEOUS | Status: DC

## 2017-10-22 MED ORDER — EPHEDRINE 5 MG/ML INJ
INTRAVENOUS | Status: AC
  Filled 2017-10-22: qty 10

## 2017-10-22 MED ORDER — ONDANSETRON HCL 4 MG/2ML IJ SOLN
INTRAMUSCULAR | Status: AC
  Filled 2017-10-22: qty 2

## 2017-10-22 MED ORDER — CELECOXIB 200 MG PO CAPS
200.0000 mg | ORAL_CAPSULE | ORAL | Status: AC
  Administered 2017-10-22: 200 mg via ORAL

## 2017-10-22 MED ORDER — SULFAMETHOXAZOLE-TRIMETHOPRIM 800-160 MG PO TABS: 1.0000 | ORAL_TABLET | Freq: Two times a day (BID) | ORAL | 0 refills | Status: DC

## 2017-10-22 MED ORDER — MIDAZOLAM HCL 2 MG/2ML IJ SOLN
1.0000 mg | INTRAMUSCULAR | Status: DC | PRN
  Administered 2017-10-22: 2 mg via INTRAVENOUS

## 2017-10-22 MED ORDER — PROMETHAZINE HCL 25 MG/ML IJ SOLN: 6.2500 mg | INTRAMUSCULAR | Status: DC | PRN

## 2017-10-22 MED ORDER — EPHEDRINE SULFATE 50 MG/ML IJ SOLN
INTRAMUSCULAR | Status: DC | PRN
  Administered 2017-10-22 (×8): 10 mg via INTRAVENOUS

## 2017-10-22 MED ORDER — ACETAMINOPHEN 500 MG PO TABS
1000.0000 mg | ORAL_TABLET | ORAL | Status: AC
  Administered 2017-10-22: 1000 mg via ORAL

## 2017-10-22 MED ORDER — GABAPENTIN 300 MG PO CAPS
ORAL_CAPSULE | ORAL | Status: AC
  Filled 2017-10-22: qty 1

## 2017-10-22 MED ORDER — DEXAMETHASONE SODIUM PHOSPHATE 10 MG/ML IJ SOLN
INTRAMUSCULAR | Status: AC
  Filled 2017-10-22: qty 1

## 2017-10-22 MED ORDER — CEFAZOLIN SODIUM-DEXTROSE 2-4 GM/100ML-% IV SOLN
INTRAVENOUS | Status: AC
  Filled 2017-10-22: qty 100

## 2017-10-22 MED ORDER — MIDAZOLAM HCL 2 MG/2ML IJ SOLN
INTRAMUSCULAR | Status: AC
  Filled 2017-10-22: qty 2

## 2017-10-22 MED ORDER — ACETAMINOPHEN 500 MG PO TABS
ORAL_TABLET | ORAL | Status: AC
  Filled 2017-10-22: qty 2

## 2017-10-22 MED ORDER — SCOPOLAMINE 1 MG/3DAYS TD PT72: 1.0000 | MEDICATED_PATCH | Freq: Once | TRANSDERMAL | Status: DC | PRN

## 2017-10-22 MED ORDER — LACTATED RINGERS IV SOLN
INTRAVENOUS | Status: DC
  Administered 2017-10-22 (×2): via INTRAVENOUS

## 2017-10-22 MED ORDER — FENTANYL CITRATE (PF) 100 MCG/2ML IJ SOLN: 25.0000 ug | INTRAMUSCULAR | Status: DC | PRN

## 2017-10-22 MED ORDER — SODIUM CHLORIDE 0.9 % IV SOLN
INTRAVENOUS | Status: DC | PRN
  Administered 2017-10-22: 500 mL

## 2017-10-22 MED ORDER — LIDOCAINE HCL (CARDIAC) 20 MG/ML IV SOLN
INTRAVENOUS | Status: DC | PRN
  Administered 2017-10-22: 40 mg via INTRAVENOUS

## 2017-10-22 MED ORDER — PROPOFOL 10 MG/ML IV BOLUS
INTRAVENOUS | Status: DC | PRN
Start: 2017-10-22 — End: 2017-10-22
  Administered 2017-10-22: 150 mg via INTRAVENOUS
  Administered 2017-10-22: 50 mg via INTRAVENOUS

## 2017-10-22 MED ORDER — ONDANSETRON HCL 4 MG/2ML IJ SOLN
INTRAMUSCULAR | Status: DC | PRN
  Administered 2017-10-22: 4 mg via INTRAVENOUS

## 2017-10-22 MED ORDER — DEXAMETHASONE SODIUM PHOSPHATE 10 MG/ML IJ SOLN
INTRAMUSCULAR | Status: DC | PRN
  Administered 2017-10-22: 10 mg via INTRAVENOUS

## 2017-10-22 MED ORDER — CELECOXIB 200 MG PO CAPS
ORAL_CAPSULE | ORAL | Status: AC
  Filled 2017-10-22: qty 1

## 2017-10-22 MED ORDER — FENTANYL CITRATE (PF) 100 MCG/2ML IJ SOLN
50.0000 ug | INTRAMUSCULAR | Status: DC | PRN
  Administered 2017-10-22: 100 ug via INTRAVENOUS
  Administered 2017-10-22: 25 ug via INTRAVENOUS

## 2017-10-22 MED ORDER — LIDOCAINE 2% (20 MG/ML) 5 ML SYRINGE
INTRAMUSCULAR | Status: AC
  Filled 2017-10-22: qty 5

## 2017-10-22 MED ORDER — FENTANYL CITRATE (PF) 100 MCG/2ML IJ SOLN
INTRAMUSCULAR | Status: AC
  Filled 2017-10-22: qty 2

## 2017-10-22 MED ORDER — SUGAMMADEX SODIUM 200 MG/2ML IV SOLN
INTRAVENOUS | Status: AC
  Filled 2017-10-22: qty 2

## 2017-10-22 MED ORDER — GABAPENTIN 300 MG PO CAPS
300.0000 mg | ORAL_CAPSULE | ORAL | Status: AC
  Administered 2017-10-22: 300 mg via ORAL

## 2017-10-22 MED ORDER — CEFAZOLIN SODIUM-DEXTROSE 2-4 GM/100ML-% IV SOLN
2.0000 g | INTRAVENOUS | Status: AC
  Administered 2017-10-22: 2 g via INTRAVENOUS

## 2017-10-22 MED ORDER — ROCURONIUM BROMIDE 10 MG/ML (PF) SYRINGE
PREFILLED_SYRINGE | INTRAVENOUS | Status: AC
Start: 2017-10-22 — End: 2017-10-22
  Filled 2017-10-22: qty 5

## 2017-10-22 MED ORDER — KETOROLAC TROMETHAMINE 30 MG/ML IJ SOLN
INTRAMUSCULAR | Status: AC
  Filled 2017-10-22: qty 1

## 2017-10-22 SURGICAL SUPPLY — 37 items
BAG DECANTER FOR FLEXI CONT (MISCELLANEOUS) ×2 IMPLANT
BLADE SURG 10 STRL SS (BLADE) ×1 IMPLANT
CANISTER SUCT 1200ML W/VALVE (MISCELLANEOUS) ×2 IMPLANT
CHLORAPREP W/TINT 26ML (MISCELLANEOUS) ×1 IMPLANT
COVER BACK TABLE 60X90IN (DRAPES) ×2 IMPLANT
COVER MAYO STAND STRL (DRAPES) ×2 IMPLANT
DECANTER SPIKE VIAL GLASS SM (MISCELLANEOUS) IMPLANT
DRAPE U-SHAPE 76X120 STRL (DRAPES) ×1 IMPLANT
DRSG PAD ABDOMINAL 8X10 ST (GAUZE/BANDAGES/DRESSINGS) ×1 IMPLANT
ELECT COATED BLADE 2.86 ST (ELECTRODE) ×1 IMPLANT
ELECT REM PT RETURN 9FT ADLT (ELECTROSURGICAL) ×2
ELECTRODE REM PT RTRN 9FT ADLT (ELECTROSURGICAL) ×1 IMPLANT
EVACUATOR SILICONE 100CC (DRAIN) ×1 IMPLANT
GAUZE SPONGE 4X4 12PLY STRL (GAUZE/BANDAGES/DRESSINGS) IMPLANT
GAUZE SPONGE 4X4 12PLY STRL LF (GAUZE/BANDAGES/DRESSINGS) IMPLANT
GLOVE BIO SURGEON STRL SZ 6 (GLOVE) ×2 IMPLANT
GLOVE BIOGEL PI IND STRL 7.0 (GLOVE) IMPLANT
GLOVE BIOGEL PI INDICATOR 7.0 (GLOVE) ×2
GLOVE SURG SS PI 6.5 STRL IVOR (GLOVE) ×1 IMPLANT
GOWN STRL REUS W/ TWL LRG LVL3 (GOWN DISPOSABLE) ×2 IMPLANT
GOWN STRL REUS W/TWL LRG LVL3 (GOWN DISPOSABLE) ×4
IV NS 500ML (IV SOLUTION) ×2
IV NS 500ML BAXH (IV SOLUTION) IMPLANT
KIT FILL SYSTEM UNIVERSAL (SET/KITS/TRAYS/PACK) IMPLANT
PACK BASIN DAY SURGERY FS (CUSTOM PROCEDURE TRAY) ×2 IMPLANT
PENCIL BUTTON HOLSTER BLD 10FT (ELECTRODE) ×2 IMPLANT
PIN SAFETY STERILE (MISCELLANEOUS) IMPLANT
SLEEVE SCD COMPRESS KNEE MED (MISCELLANEOUS) ×1 IMPLANT
SPONGE LAP 18X18 X RAY DECT (DISPOSABLE) ×2 IMPLANT
SUT ETHILON 4 0 PS 2 18 (SUTURE) ×3 IMPLANT
SUT MON AB 3-0 SH 27 (SUTURE) ×4
SUT MON AB 3-0 SH27 (SUTURE) IMPLANT
SYR BULB IRRIGATION 50ML (SYRINGE) ×2 IMPLANT
TOWEL OR 17X24 6PK STRL BLUE (TOWEL DISPOSABLE) ×3 IMPLANT
TUBE CONNECTING 20X1/4 (TUBING) ×2 IMPLANT
UNDERPAD 30X30 (UNDERPADS AND DIAPERS) ×4 IMPLANT
YANKAUER SUCT BULB TIP NO VENT (SUCTIONS) ×2 IMPLANT

## 2017-10-22 NOTE — Telephone Encounter (Signed)
Per 11/1 no los 

## 2017-10-22 NOTE — Anesthesia Preprocedure Evaluation (Signed)
Anesthesia Evaluation  Patient identified by MRN, date of birth, ID band Patient awake    Reviewed: Allergy & Precautions, H&P , Patient's Chart, lab work & pertinent test results, reviewed documented beta blocker date and time   Airway Mallampati: II  TM Distance: >3 FB Neck ROM: full    Dental no notable dental hx. (+) Dental Advisory Given,    Pulmonary former smoker,    Pulmonary exam normal breath sounds clear to auscultation       Cardiovascular  Rhythm:regular Rate:Normal     Neuro/Psych    GI/Hepatic   Endo/Other    Renal/GU      Musculoskeletal   Abdominal   Peds  Hematology   Anesthesia Other Findings   Reproductive/Obstetrics                             Anesthesia Physical  Anesthesia Plan  ASA: II  Anesthesia Plan: General   Post-op Pain Management:    Induction: Intravenous  PONV Risk Score and Plan: 3 and Ondansetron, Dexamethasone, Midazolam and Treatment may vary due to age or medical condition  Airway Management Planned: Oral ETT and LMA  Additional Equipment:   Intra-op Plan:   Post-operative Plan: Extubation in OR  Informed Consent: I have reviewed the patients History and Physical, chart, labs and discussed the procedure including the risks, benefits and alternatives for the proposed anesthesia with the patient or authorized representative who has indicated his/her understanding and acceptance.   Dental Advisory Given  Plan Discussed with: CRNA and Surgeon  Anesthesia Plan Comments: (  )        Anesthesia Quick Evaluation

## 2017-10-22 NOTE — Op Note (Signed)
Operative Note   DATE OF OPERATION: 11.2.18  LOCATION: [facility]-outpatient  SURGICAL DIVISION: Plastic Surgery  PREOPERATIVE DIAGNOSES:  1. Left mastectomy flap skin necrosis 2. Right breast cancer 3. Acquired absence bilateral breast 4. CHEK2 monoallelic mutation  POSTOPERATIVE DIAGNOSES:  same  PROCEDURE:  1. Excisional debridement skin and subcutaneous tissue left chest including left nipple 12 cm2 2. Layered closure left chest 10 cm  SURGEON: Irene Limbo MD MBA  ASSISTANT: none  ANESTHESIA:  General.   EBL: 10 ml  COMPLICATIONS: None immediate.   INDICATIONS FOR PROCEDURE:  The patient, Lelar, is a 41 y.o. female born on [birth year], is here for debridement left mastectomy flap. She is 2 weeks post operative from bilateral nipple sparing mastectomies with immediate prepectoral expander acellular dermis reconstruction.   FINDINGS: Full thickness mastecomy flap necrosis including nipple, inferior to nipple and upper outer quadrant. Acellular dermis not adherent to skin flaps in these areas. Saline exchange completed bilateral, total fill right 360 ml, total fill left 240 ml.  DESCRIPTION OF PROCEDURE:  The patient's operative site was marked with the patient in the preoperative area. The patient was taken to the operating room. SCDs were placed and IV antibiotics were given. The patient's operative site was prepped and draped in a sterile fashion. A time out was performed and all information was confirmed to be correct. Tangential debridement of left mastectomy flap completed with knife until bleeding tissue encountered. An approximately 1 cm2 area over upper outer quadrant excised. The resection included nipple and area over central lower pole. The total area of debridement 12 cm2. Following this the implant and acellular dermis exposed. The skin flaps over remainder of reconstruction were adherent to  ADM.The remaining skin flaps able to be approximated over  expander and elected to proceed with closure. The air present in tissue expander was evacuated and saline fill completed to 240 ml.  Wounds irrigated with saline solution containing bacitracin polymyxin followed by Betadine. Layered closure of wounds completed with 3-0 monocryl in dermis and short running and interrupted 4-0 nylon for skin closure. Total length repair 10 cm. Dermabond and dry dressing applied.   The right chest was then exposed and port accessed. The air was removed from expander and saline fill completed to 360 ml.   The patient was allowed to wake from anesthesia, extubated and taken to the recovery room in satisfactory condition.   SPECIMENS: left mastectomy flap debridement  DRAINS: 15 Fr JP remains in left breast reconstruction  Irene Limbo, MD [facility] Plastic & Reconstructive Surgery [phone], pin [phone]

## 2017-10-22 NOTE — Anesthesia Postprocedure Evaluation (Signed)
Anesthesia Post Note  Patient: Dawn Ramos  Procedure(s) Performed: DEBRIDEMENT OF LEFT MASTECTOMY FLAP (Left Breast)     Patient location during evaluation: PACU Anesthesia Type: General Level of consciousness: awake and alert Pain management: pain level controlled Vital Signs Assessment: post-procedure vital signs reviewed and stable Respiratory status: spontaneous breathing, nonlabored ventilation, respiratory function stable and patient connected to nasal cannula oxygen Cardiovascular status: blood pressure returned to baseline and stable Postop Assessment: no apparent nausea or vomiting Anesthetic complications: no    Last Vitals:  Vitals:   10/22/17 1315 10/22/17 1346  BP: 122/75 125/78  Pulse: 85 87  Resp: 15 16  Temp: (!) 36.4 C 36.4 C  SpO2: 100% 100%    Last Pain:  Vitals:   10/22/17 1346  TempSrc: Oral  PainSc: 0-No pain                 Tiajuana Amass

## 2017-10-22 NOTE — Discharge Instructions (Signed)

## 2017-10-22 NOTE — Interval H&P Note (Signed)
History and Physical Interval Note:  10/22/2017 10:34 AM  Dawn Ramos  has presented today for surgery, with the diagnosis of hx of right breast cancer and prophylactic removal of left breast  The various methods of treatment have been discussed with the patient and family. After consideration of risks, benefits and other options for treatment, the patient has consented to  Procedure(s): DEBRIDEMENT WOUND left of mastectomy flap with possible removal of tissue expander (Left) as a surgical intervention .  The patient's history has been reviewed, patient examined, no change in status, stable for surgery.  I have reviewed the patient's chart and labs.  Questions were answered to the patient's satisfaction.     Kemari Mares

## 2017-10-22 NOTE — Anesthesia Procedure Notes (Signed)
Procedure Name: LMA Insertion Date/Time: 10/22/2017 11:24 AM Performed by: Bufford Spikes Pre-anesthesia Checklist: Patient identified, Emergency Drugs available, Suction available and Patient being monitored Patient Re-evaluated:Patient Re-evaluated prior to induction Oxygen Delivery Method: Circle system utilized Preoxygenation: Pre-oxygenation with 100% oxygen Induction Type: IV induction Ventilation: Mask ventilation without difficulty LMA: LMA inserted LMA Size: 4.0 Number of attempts: 1 Airway Equipment and Method: Bite block Placement Confirmation: positive ETCO2 Tube secured with: Tape Dental Injury: Teeth and Oropharynx as per pre-operative assessment

## 2017-10-22 NOTE — Transfer of Care (Signed)
Immediate Anesthesia Transfer of Care Note  Patient: Dawn Ramos  Procedure(s) Performed: DEBRIDEMENT OF LEFT MASTECTOMY FLAP (Left Breast)  Patient Location: PACU  Anesthesia Type:General  Level of Consciousness: awake, alert  and oriented  Airway & Oxygen Therapy: Patient Spontanous Breathing and Patient connected to face mask oxygen  Post-op Assessment: Report given to RN and Post -op Vital signs reviewed and stable  Post vital signs: Reviewed and stable  Last Vitals:  Vitals:   10/22/17 1043  BP: 108/65  Pulse: 93  Resp: 18  Temp: 36.7 C  SpO2: 100%    Last Pain:  Vitals:   10/22/17 1043  TempSrc: Oral  PainSc: 1       Patients Stated Pain Goal: 1 (33/61/22 [id number])  Complications: No apparent anesthesia complications

## 2017-10-25 ENCOUNTER — Encounter: Payer: Self-pay | Admitting: Radiation Oncology

## 2017-10-25 ENCOUNTER — Encounter (HOSPITAL_BASED_OUTPATIENT_CLINIC_OR_DEPARTMENT_OTHER): Payer: Self-pay | Admitting: Plastic Surgery

## 2017-11-03 NOTE — Progress Notes (Signed)
Location of Breast Cancer: Upper-outer quadrant of right breast in female   Histology per Pathology Report:   Diagnosis 10-22-17  Dr. Irene Limbo , M.D, Soft tissue, debridement, left mastectomy flap - ULCERATED SKIN AND SUBCUTANEOUS TISSUE WITH NECROSIS AND ACUTE INFLAMMATION - NO MALIGNANCY IDENTIFIED  Diagnosis  10-07-17 1. Breast, simple mastectomy, Left - PSEUDOANGIOMATOUS STROMAL HYPERPLASIA ([place]). - FIBROCYSTIC CHANGE. - USUAL DUCTAL HYPERPLASIA. - NO MALIGNANCY IDENTIFIED. 2. Lymph node, sentinel, biopsy, Right Axillary #1 - FIBROADENOMA. - NO LYMPHOID TISSUE. - NO MALIGNANCY IDENTIFIED. 3. Lymph node, sentinel, biopsy, Right - ONE OF ONE LYMPH NODES IS NEGATIVE FOR CARCINOMA (0/1). 4. Lymph node, sentinel, biopsy, Right - ONE OF ONE LYMPH NODES IS NEGATIVE FOR CARCINOMA (0/1). 5. Lymph node, sentinel, biopsy, Right - MICROMETASTASIS IN ONE OF ONE LYMPH NODES (1/1). 6. Lymph node, sentinel, biopsy, Right - ONE OF ONE LYMPH NODES IS NEGATIVE FOR CARCINOMA (0/1). 7. Lymph node, sentinel, biopsy, Right Axillary #2 - ONE OF ONE LYMPH NODES IS NEGATIVE FOR CARCINOMA (0/1). 8. Lymph node, sentinel, biopsy, Right - ONE OF ONE LYMPH NODES IS NEGATIVE FOR CARCINOMA (0/1). 9. Lymph node, sentinel, biopsy, Right Axillary #3 - ONE OF ONE LYMPH NODES IS NEGATIVE FOR CARCINOMA (0/1). 10. Lymph node, sentinel, biopsy, Right Axillary #4 - ONE OF ONE LYMPH NODES IS NEGATIVE FOR CARCINOMA (0/1). 11. Lymph node, sentinel, biopsy, Right Axillary #5 - ONE OF ONE LYMPH NODES IS NEGATIVE FOR CARCINOMA (0/1). 12. Lymph node, sentinel, biopsy, Right - ONE OF ONE LYMPH NODES IS NEGATIVE FOR CARCINOMA (0/1). Diagnosis(continued) 13. Breast, simple mastectomy, Right - INVASIVE LOBULAR CARCINOMA, GRADE 2, SPANNING 3.0 CM. - LOBULAR NEOPLASIA (LOBULAR CARCINOMA IN SITU). - INVASIVE CARCINOMA IS BROADLY PRESENT AT THE POSTERIOR MARGIN. - SEE ONCOLOGY TABLE. Microscopic Comment 13.  BREAST, INVASIVE TUMOR Procedure: Right simple mastectomy with right axillary sentinel lymph nodes . Receptor Status: ER(100% +), PR (100% +), Her2-neu (neg ratio 1.41), Ki-(10%)  Diagnosis 08-11-17 1. Breast, right, needle core biopsy, 11:00 o'clock - INVASIVE AND IN SITU MAMMARY CARCINOMA. - SEE COMMENT. 2. Breast, right, needle core biopsy, 10:00 o'clock lymph node - FIBROCYSTIC CHANGES. - LYMPH NODAL TISSUE IS NOT IDENTIFIED. - THERE IS NO EVIDENCE OF MALIGNANCY. - SEE COMMENT. Microscopic Comment 1. , 2. The carcinoma appears grade II. An E-cadherin and a breast prognostic profile will be performed and the results reported separately. The results were called to [facility] on 08/12/2017. (JBK:ecj 08/12/2017)  Receptor Status: ER(100% +), PR (100% +), Her2-neu (neg ratio 1.41), Ki-(10%)   Did patient present with symptoms (if so, please note symptoms) or was this found on screening mammography?:She felt the lump 2 months ago had a mammogram  Past/Anticipated interventions by surgeon, if any: Dr. Irene Limbo, M.D.10-22-17 Soft tissue, debridement, left mastectomy flap, Dr. Stark Klein 10-07-17 [place], 08-11-17 Breast biopsy left breast  Past/Anticipated interventions by medical oncology, if any: Chemotherapy 10-21-17 Dr. Burr Medico Radiation referral, adjuvant tamoxifen and ovarian suppression   Dr. Burr Medico  10-14-17 Oncotype DX score result 18, 08-29-17 Genetic Testing  Lymphedema issues, if any:  No Skin to her left breast with old bruising and pink healing tissue, reports she is still having yellowish to green drainage at times without odor.  She saw Dr. Iran Planas last Tuesday, 11-02-17 had sutures and JP removed and will see her again tomorrow to remove the remaining sutures. She saw Dr. Barry Dienes the week before for a post op  visit. She has good ROM to both arms. Pain issues, if any: No  SAFETY  ISSUES:  Prior radiation? No  Pacemaker/ICD? : No  Possible current pregnancy? No  Is the patient on methotrexate?: No   Menarchal: 13 LMP: 08/17/17 Contraceptive: IUD 12 years ago HRT: No GP: G4P0A4, 3 abortions and 1 miscarriage  Wt Readings from Last 3 Encounters:  11/09/17 129 lb 12.8 oz (58.9 kg)  10/22/17 125 lb 12.8 oz (57.1 kg)  10/21/17 127 lb 1.6 oz (57.7 kg)   Current Complaints / other details: 41 y.o. woman with a history of ADHD,bipolar 1 disorder , depression and a family history of hodgkin's lymphoma and melanoma paternal uncle. BP 114/75 (BP Location: Right Leg, Patient Position: Sitting, Cuff Size: Normal)   Pulse 100   Temp 97.7 F (36.5 C) (Oral)   Resp 18   Ht '5\' 7"'$  (1.702 m)   Wt 129 lb 12.8 oz (58.9 kg)   LMP 10/13/2017 (Approximate)   SpO2 100%   BMI 20.33 kg/m     Georgena Spurling, RN 11/03/2017,4:44 PM

## 2017-11-09 ENCOUNTER — Ambulatory Visit
Admission: RE | Admit: 2017-11-09 | Discharge: 2017-11-09 | Disposition: A | Discharge status: Home / Self Care | Payer: BLUE CROSS/BLUE SHIELD | Source: Ambulatory Visit | Attending: Radiation Oncology | Admitting: Radiation Oncology

## 2017-11-09 ENCOUNTER — Encounter: Payer: Self-pay | Admitting: Radiation Oncology

## 2017-11-09 ENCOUNTER — Other Ambulatory Visit: Payer: Self-pay

## 2017-11-09 VITALS — BP 114/75 | HR 100 | Temp 97.7°F | Resp 18 | Ht 67.0 in | Wt 129.8 lb

## 2017-11-09 DIAGNOSIS — Z720 Tobacco use: Secondary | ICD-10-CM | POA: Diagnosis not present

## 2017-11-09 DIAGNOSIS — C50411 Malignant neoplasm of upper-outer quadrant of right female breast: Secondary | ICD-10-CM

## 2017-11-09 DIAGNOSIS — Z170 Estrogen receptor positive status [ER+]: Secondary | ICD-10-CM | POA: Diagnosis present

## 2017-11-09 DIAGNOSIS — F909 Attention-deficit hyperactivity disorder, unspecified type: Secondary | ICD-10-CM | POA: Diagnosis not present

## 2017-11-09 DIAGNOSIS — F319 Bipolar disorder, unspecified: Secondary | ICD-10-CM | POA: Diagnosis not present

## 2017-11-09 DIAGNOSIS — Z79899 Other long term (current) drug therapy: Secondary | ICD-10-CM | POA: Diagnosis not present

## 2017-11-09 NOTE — Progress Notes (Signed)
Radiation Oncology         ([phone] ________________________________  Name: Dawn Ramos        MRN: [id number]  Date of Service: 11/09/2017 DOB: [birth year]  AC:ZYSAYTK, No Pcp Per  Truitt Merle, MD     REFERRING PHYSICIAN: Truitt Merle, MD   DIAGNOSIS: The encounter diagnosis was Malignant neoplasm of upper-outer quadrant of right breast in female, estrogen receptor positive ([place]).   HISTORY OF PRESENT ILLNESS: Dawn Ramos is a 41 y.o. female originally seen in the multidisciplinary breast clinic for a new diagnosis of right breast cancer. The patient self palpated a mass in the right breast and proceeded with mammogram on 08/06/17 which revealed a mass in the right breast. Diagnostic imaging revealed a 2.1 x 1.5 x 1.1 cm at 11:00, and a lesion along the axillary tail was also seen measruring 1.1 x .5 x 1.3 cm at 10:00. A biopsy of both lesions was obtained on 08/11/17 and the second lesion in the axillary tail labeled as a node did not contain nodal tissue, and was negative for disease. The breast mass was noted to have an invasive lobular carcinoma, grade 2 with the following studies: ER/PR positive, HER2 negative, Ki 10%. She underwent bilaterally mastectomies with right sentinel node evaluation on 10/07/17. Final pathology revealed a grad 2 invasive lobular carcinoma measuring 3 cm with LCIS in the right breast. The left breast was negative for cancer, and the 10 nodes that were sampled on the right were evaluated and one contained micrometastatic disease. She had reconstruction at that time also. Unfortunately she developed a postoperative issues with necrosis in the left surgical site, and underwent left mastectomy flap debridement on 10/22/17 with Dr. Iran Planas. Final pathology did not reveal malignancy, but revealed necrosis and inflammatory changes consistent with infection. She comes today to discuss options of radiotherapy.   PREVIOUS RADIATION THERAPY: No   PAST  MEDICAL HISTORY:  Past Medical History:  Diagnosis Date  . ADHD   . Bipolar 1 disorder ([place])   . Cancer ([place])   . Depression        PAST SURGICAL HISTORY: Past Surgical History:  Procedure Laterality Date  . BREAST RECONSTRUCTION WITH PLACEMENT OF TISSUE EXPANDER AND FLEX HD (ACELLULAR HYDRATED DERMIS) Bilateral 10/07/2017   Procedure: BREAST RECONSTRUCTION WITH PLACEMENT OF TISSUE EXPANDER AND ALLODERM;  Surgeon: Irene Limbo, MD;  Location: [facility];  Service: Plastics;  Laterality: Bilateral;  . INCISION AND DRAINAGE OF WOUND Left 10/22/2017   Procedure: DEBRIDEMENT OF LEFT MASTECTOMY FLAP;  Surgeon: Irene Limbo, MD;  Location: [facility];  Service: Plastics;  Laterality: Left;  . NIPPLE SPARING MASTECTOMY/SENTINAL LYMPH NODE BIOPSY/RECONSTRUCTION/PLACEMENT OF TISSUE EXPANDER Bilateral 10/07/2017   Procedure: BILATERAL NIPPLE SPARING MASTECTOMY WITH RIGHT SENTINEL LYMPH NODE BIOPSY;  Surgeon: Stark Klein, MD;  Location: [facility];  Service: General;  Laterality: Bilateral;  . ROOT CANAL     3  . WRIST SURGERY Left      FAMILY HISTORY:  Family History  Problem Relation Age of Onset  . Lymphoma Paternal Uncle 41  . Melanoma Paternal Uncle 83     SOCIAL HISTORY:  reports that she quit smoking about 2 months ago. Her smoking use included cigarettes. She has a 8.50 pack-year smoking history. she has never used smokeless tobacco. She reports that she uses drugs. Drug: Other-see comments. She reports that she does not drink alcohol. The patient is married and lives in [place]. She is accompanied by her husband and  parents.   ALLERGIES: Macrobid [nitrofurantoin macrocrystal] and Bee venom   MEDICATIONS:  Current Outpatient Medications  Medication Sig Dispense Refill  . carbamazepine (CARBATROL) 100 MG 12 hr capsule Take 200 mg by mouth at bedtime.    Nyoka Lint Quai 500 MG CAPS Take 500 mg at bedtime as needed by mouth.    . escitalopram (LEXAPRO) 10 MG  tablet Take 10 mg by mouth at bedtime.     Marland Kitchen ibuprofen (ADVIL,MOTRIN) 200 MG tablet Take 400 mg by mouth every 8 (eight) hours as needed for cramping.    . lisdexamfetamine (VYVANSE) 50 MG capsule Take 50 mg by mouth daily.    . Magnesium Gluconate 550 MG TABS Take 30 mg by mouth daily.    . Multiple Vitamin (MULTIVITAMIN) tablet Take 1 tablet by mouth daily.    . vitamin C (ASCORBIC ACID) 500 MG tablet Take 500 mg by mouth daily.     No current facility-administered medications for this encounter.      REVIEW OF SYSTEMS: On review of systems, the patient reports that she is doing okay since her surgeries. She is having some limitations past 90 degrees with her right arm but is able to tolerate most movement. She denies any edema of the axilla on the right or in her RUE. She denies any chest pain, shortness of breath, cough, fevers, chills, night sweats, unintended weight changes. She denies any bowel or bladder disturbances, and denies abdominal pain, nausea or vomiting. She denies any new musculoskeletal or joint aches or pains. A complete review of systems is obtained and is otherwise negative.   PHYSICAL EXAM:  Wt Readings from Last 3 Encounters:  11/09/17 129 lb 12.8 oz (58.9 kg)  10/22/17 125 lb 12.8 oz (57.1 kg)  10/21/17 127 lb 1.6 oz (57.7 kg)   Temp Readings from Last 3 Encounters:  11/09/17 97.7 F (36.5 C) (Oral)  10/22/17 97.6 F (36.4 C) (Oral)  10/21/17 98.4 F (36.9 C) (Oral)   BP Readings from Last 3 Encounters:  11/09/17 114/75  10/22/17 125/78  10/21/17 112/70   Pulse Readings from Last 3 Encounters:  11/09/17 100  10/22/17 87  10/21/17 97   Pain Assessment Pain Score: 0-No pain/10  In general this is a well appearing caucasian female in no acute distress. She is alert and oriented x4 and appropriate throughout the examination. HEENT reveals that the patient is normocephalic, atraumatic. EOMs are intact. PERRLA. Cardiopulmonary assessment is negative for  acute distress and she exhibits normal effort. Bilateral breasts are evaluated and reveal well healing mastectomy scar on the right, and her right axilla is also healing well. No edema or erythema is noted of either. Her tissue expander is in place without abnormality seen. On the left, the mastectomy scar is healing well, and her necrosis resection incision is also healing well with fibrin and granulation tissue seen.     ECOG = 0  0 - Asymptomatic (Fully active, able to carry on all predisease activities without restriction)  1 - Symptomatic but completely ambulatory (Restricted in physically strenuous activity but ambulatory and able to carry out work of a light or sedentary nature. For example, light housework, office work)  2 - Symptomatic, <50% in bed during the day (Ambulatory and capable of all self care but unable to carry out any work activities. Up and about more than 50% of waking hours)  3 - Symptomatic, >50% in bed, but not bedbound (Capable of only limited self-care, confined to bed or  chair 50% or more of waking hours)  4 - Bedbound (Completely disabled. Cannot carry on any self-care. Totally confined to bed or chair)  5 - Death   Eustace Pen MM, Creech RH, Tormey DC, et al. [phone]). "Toxicity and response criteria of the [facility]". [place] Oncol. 5 (6): 649-55    LABORATORY DATA:  Lab Results  Component Value Date   WBC 5.7 09/30/2017   HGB 13.5 09/30/2017   HCT 40.1 09/30/2017   MCV 91.3 09/30/2017   PLT 222 09/30/2017   Lab Results  Component Value Date   NA 137 09/30/2017   K 4.2 09/30/2017   CL 100 (L) 09/30/2017   CO2 27 09/30/2017   Lab Results  Component Value Date   ALT 18 09/30/2017   AST 24 09/30/2017   ALKPHOS 47 09/30/2017   BILITOT 0.7 09/30/2017      RADIOGRAPHY: No results found.     IMPRESSION/PLAN: 1. Stage IB, [id number] grade 2 ER/PR positive invasive lobular carcinoma of the right breast. Dr. MLisbeth Renshawreviews  the findings from her pathology and reviews the role for postoperative adjuvant therapy given her posterior margin involvement. He reviews the risks, benefits, short, and long term effects of radiotherapy, and the patient is interested in proceeding. Dr. MLisbeth Renshawdiscusses the delivery and logistics of radiotherapy and would recommend 6 1/2 weeks of treatment to the chest wall. Written consent is obtained and placed in the chart, a copy was provided to the patient. We will plan for simulation in the next week also.   In a visit lasting 25 minutes, greater than 50% of the time was spent face to face discussing her case, and coordinating the patient's care.   The above documentation reflects my direct findings during this shared patient visit. Please see the separate note by Dr. MLisbeth Renshawon this date for the remainder of the patient's plan of care.    ACarola Rhine PAC

## 2017-11-16 ENCOUNTER — Telehealth: Payer: Self-pay | Admitting: *Deleted

## 2017-11-16 ENCOUNTER — Ambulatory Visit: Payer: BLUE CROSS/BLUE SHIELD | Admitting: Radiation Oncology

## 2017-11-16 NOTE — Telephone Encounter (Signed)
error 

## 2017-11-16 NOTE — Telephone Encounter (Signed)
Called patient home/cell same number no answer, called her work, spoke with patient and she has agreed to come tomorrow at 1pm instead of today for ct simulation, I apologized for calling her at work that we had an emergency in patient that needed todays' time slot, she agreed no trouble, and will be her tomorrow, thanked her again, let Katie in Ct sim know of date and time change 1:15 PM

## 2017-11-17 ENCOUNTER — Ambulatory Visit
Admission: RE | Admit: 2017-11-17 | Discharge: 2017-11-17 | Disposition: A | Discharge status: Home / Self Care | Payer: BLUE CROSS/BLUE SHIELD | Source: Ambulatory Visit | Attending: Radiation Oncology | Admitting: Radiation Oncology

## 2017-11-17 DIAGNOSIS — Z510 Encounter for antineoplastic radiation therapy: Secondary | ICD-10-CM | POA: Diagnosis not present

## 2017-11-17 DIAGNOSIS — Z170 Estrogen receptor positive status [ER+]: Secondary | ICD-10-CM

## 2017-11-17 DIAGNOSIS — C50411 Malignant neoplasm of upper-outer quadrant of right female breast: Secondary | ICD-10-CM | POA: Diagnosis not present

## 2017-11-20 ENCOUNTER — Telehealth: Payer: Self-pay | Admitting: Hematology

## 2017-11-20 NOTE — Telephone Encounter (Signed)
Scheduled appt per 11/28 sch msg - left message for patient regarding appt that was added. Sending confirmation letter in the mail regarding the appts.

## 2017-11-23 DIAGNOSIS — Z510 Encounter for antineoplastic radiation therapy: Secondary | ICD-10-CM | POA: Diagnosis not present

## 2017-11-24 ENCOUNTER — Ambulatory Visit
Admission: RE | Admit: 2017-11-24 | Discharge: 2017-11-24 | Disposition: A | Discharge status: Home / Self Care | Payer: BLUE CROSS/BLUE SHIELD | Source: Ambulatory Visit | Attending: Radiation Oncology | Admitting: Radiation Oncology

## 2017-11-24 DIAGNOSIS — Z510 Encounter for antineoplastic radiation therapy: Secondary | ICD-10-CM | POA: Diagnosis not present

## 2017-11-25 ENCOUNTER — Ambulatory Visit
Admission: RE | Admit: 2017-11-25 | Discharge: 2017-11-25 | Disposition: A | Discharge status: Home / Self Care | Payer: BLUE CROSS/BLUE SHIELD | Source: Ambulatory Visit | Attending: Radiation Oncology | Admitting: Radiation Oncology

## 2017-11-25 DIAGNOSIS — C50411 Malignant neoplasm of upper-outer quadrant of right female breast: Secondary | ICD-10-CM

## 2017-11-25 DIAGNOSIS — Z170 Estrogen receptor positive status [ER+]: Principal | ICD-10-CM

## 2017-11-25 DIAGNOSIS — Z510 Encounter for antineoplastic radiation therapy: Secondary | ICD-10-CM | POA: Diagnosis not present

## 2017-11-25 MED ORDER — RADIAPLEXRX EX GEL
Freq: Once | CUTANEOUS | Status: AC
  Administered 2017-11-25: 17:00:00 via TOPICAL

## 2017-11-25 MED ORDER — ALRA NON-METALLIC DEODORANT (RAD-ONC)
1.0000 "application " | Freq: Once | TOPICAL | Status: AC
  Administered 2017-11-25: 1 via TOPICAL

## 2017-11-25 NOTE — Progress Notes (Addendum)
Pt here for patient teaching.  Pt given Radiation and You booklet, skin care instructions, Alra deodorant and Radiaplex gel. Pt reports they have not watched the video link given to watch the video  Radiation Therapy Education video on 11-25-17.  Reviewed areas of pertinence such as fatigue, hair loss, skin changes, breast tenderness, breast swelling.  Pt able to give teach back of to pat skin, use unscented/gentle soap, and drink plenty of water,apply Radiaplex bid, avoid applying anything to skin within 4 hours of treatment, avoid wearing an under wire bra and to use an electric razor if they must shave. Pt demonstrated understanding of information given and will contact nursing with any questions or concerns.    Http://rtanswers.org/treatmentinformation/whattoexp

## 2017-11-26 ENCOUNTER — Ambulatory Visit
Admission: RE | Admit: 2017-11-26 | Discharge: 2017-11-26 | Disposition: A | Discharge status: Home / Self Care | Payer: BLUE CROSS/BLUE SHIELD | Source: Ambulatory Visit | Attending: Radiation Oncology | Admitting: Radiation Oncology

## 2017-11-26 DIAGNOSIS — Z510 Encounter for antineoplastic radiation therapy: Secondary | ICD-10-CM | POA: Diagnosis not present

## 2017-11-29 ENCOUNTER — Ambulatory Visit: Payer: BLUE CROSS/BLUE SHIELD

## 2017-11-30 ENCOUNTER — Ambulatory Visit
Admission: RE | Admit: 2017-11-30 | Discharge: 2017-11-30 | Disposition: A | Discharge status: Home / Self Care | Payer: BLUE CROSS/BLUE SHIELD | Source: Ambulatory Visit | Attending: Radiation Oncology | Admitting: Radiation Oncology

## 2017-11-30 DIAGNOSIS — Z510 Encounter for antineoplastic radiation therapy: Secondary | ICD-10-CM | POA: Diagnosis not present

## 2017-11-30 NOTE — Progress Notes (Signed)
  Radiation Oncology         [phone]) [phone] ________________________________  Name: Dawn Ramos MRN: [id number]  Date: 11/17/2017  DOB: 08/16/1976  Optical Surface Tracking Plan:  Since intensity modulated radiotherapy (IMRT) and 3D conformal radiation treatment methods are predicated on accurate and precise positioning for treatment, intrafraction motion monitoring is medically necessary to ensure accurate and safe treatment delivery.  The ability to quantify intrafraction motion without excessive ionizing radiation dose can only be performed with optical surface tracking. Accordingly, surface imaging offers the opportunity to obtain 3D measurements of patient position throughout IMRT and 3D treatments without excessive radiation exposure.  I am ordering optical surface tracking for this patient's upcoming course of radiotherapy. ________________________________  Kyung Rudd, MD 11/30/2017 8:59 AM    Reference:   Ursula Alert, J, et al. Surface imaging-based analysis of intrafraction motion for breast radiotherapy patients.Journal of [place], n. 6, nov. 2014. ISSN 12248250.   Available at: <http://www.jacmp.org/index.php/jacmp/article/view/4957>.

## 2017-11-30 NOTE — Progress Notes (Signed)
  Radiation Oncology         [phone]) [phone] ________________________________  Name: Dawn Ramos MRN: [id number]  Date: 11/17/2017  DOB: July 26, 1976  DIAGNOSIS:     ICD-10-CM   1. Malignant neoplasm of upper-outer quadrant of right breast in female, estrogen receptor positive ([place]) C50.411    Z17.0      SIMULATION AND TREATMENT PLANNING NOTE  The patient presented for simulation prior to beginning her course of radiation treatment for her diagnosis of right-sided breast cancer. The patient was placed in a supine position on a breast board. A customized vac-lock bag was also constructed and this complex treatment device will be used on a daily basis during her treatment. In this fashion, a CT scan was obtained through the chest area and an isocenter was placed near the chest wall at the upper aspect of the right chest.  The patient will be planned to receive a course of radiation initially to a dose of 50.4 gray. This will consist of a 4 field technique targeting the right chest wall as well as the supraclavicular region. Therefore 2 customized medial and lateral tangent fields have been created targeting the chest wall, and also 2 additional customized fields have been designed to treat the supraclavicular region both with a right supraclavicular field and a right posterior axillary boost field. A forward planning/reduced field technique will also be evaluated to determine if this significantly improves the dose homogeneity of the overall plan. Therefore, additional customized blocks/fields may be necessary.  This initial treatment will be accomplished at 1.8 gray per fraction.   The initial plan will consist of a 3-D conformal technique. The target volume/scar, heart and lungs have been contoured and dose volume histograms of each of these structures will be evaluated as part of the 3-D conformal treatment planning process.   It is anticipated that the patient will then receive a 10 gray  boost to the surgical scar. This will be accomplished at 2 gray per fraction. The final anticipated total dose therefore will correspond to 60.4 gray.    _______________________________   Jodelle Gross, MD, PhD

## 2017-12-01 ENCOUNTER — Ambulatory Visit
Admission: RE | Admit: 2017-12-01 | Discharge: 2017-12-01 | Disposition: A | Discharge status: Home / Self Care | Payer: BLUE CROSS/BLUE SHIELD | Source: Ambulatory Visit | Attending: Radiation Oncology | Admitting: Radiation Oncology

## 2017-12-01 DIAGNOSIS — Z510 Encounter for antineoplastic radiation therapy: Secondary | ICD-10-CM | POA: Diagnosis not present

## 2017-12-02 ENCOUNTER — Ambulatory Visit
Admission: RE | Admit: 2017-12-02 | Discharge: 2017-12-02 | Disposition: A | Discharge status: Home / Self Care | Payer: BLUE CROSS/BLUE SHIELD | Source: Ambulatory Visit | Attending: Radiation Oncology | Admitting: Radiation Oncology

## 2017-12-02 DIAGNOSIS — Z510 Encounter for antineoplastic radiation therapy: Secondary | ICD-10-CM | POA: Diagnosis not present

## 2017-12-03 ENCOUNTER — Ambulatory Visit
Admission: RE | Admit: 2017-12-03 | Discharge: 2017-12-03 | Disposition: A | Discharge status: Home / Self Care | Payer: BLUE CROSS/BLUE SHIELD | Source: Ambulatory Visit | Attending: Radiation Oncology | Admitting: Radiation Oncology

## 2017-12-03 DIAGNOSIS — Z170 Estrogen receptor positive status [ER+]: Principal | ICD-10-CM

## 2017-12-03 DIAGNOSIS — Z510 Encounter for antineoplastic radiation therapy: Secondary | ICD-10-CM | POA: Diagnosis not present

## 2017-12-03 DIAGNOSIS — C50411 Malignant neoplasm of upper-outer quadrant of right female breast: Secondary | ICD-10-CM

## 2017-12-03 MED ORDER — RADIAPLEXRX EX GEL
Freq: Once | CUTANEOUS | Status: AC
  Administered 2017-12-03: 16:00:00 via TOPICAL

## 2017-12-04 ENCOUNTER — Ambulatory Visit
Admission: RE | Admit: 2017-12-04 | Discharge: 2017-12-04 | Disposition: A | Discharge status: Home / Self Care | Payer: BLUE CROSS/BLUE SHIELD | Source: Ambulatory Visit | Attending: Radiation Oncology | Admitting: Radiation Oncology

## 2017-12-04 DIAGNOSIS — Z510 Encounter for antineoplastic radiation therapy: Secondary | ICD-10-CM | POA: Diagnosis not present

## 2017-12-06 ENCOUNTER — Ambulatory Visit
Admission: RE | Admit: 2017-12-06 | Discharge: 2017-12-06 | Disposition: A | Discharge status: Home / Self Care | Payer: BLUE CROSS/BLUE SHIELD | Source: Ambulatory Visit | Attending: Radiation Oncology | Admitting: Radiation Oncology

## 2017-12-06 DIAGNOSIS — Z510 Encounter for antineoplastic radiation therapy: Secondary | ICD-10-CM | POA: Diagnosis not present

## 2017-12-07 ENCOUNTER — Ambulatory Visit
Admission: RE | Admit: 2017-12-07 | Discharge: 2017-12-07 | Disposition: A | Discharge status: Home / Self Care | Payer: BLUE CROSS/BLUE SHIELD | Source: Ambulatory Visit | Attending: Radiation Oncology | Admitting: Radiation Oncology

## 2017-12-07 DIAGNOSIS — Z510 Encounter for antineoplastic radiation therapy: Secondary | ICD-10-CM | POA: Diagnosis not present

## 2017-12-08 ENCOUNTER — Ambulatory Visit
Admission: RE | Admit: 2017-12-08 | Discharge: 2017-12-08 | Disposition: A | Discharge status: Home / Self Care | Payer: BLUE CROSS/BLUE SHIELD | Source: Ambulatory Visit | Attending: Radiation Oncology | Admitting: Radiation Oncology

## 2017-12-08 DIAGNOSIS — Z510 Encounter for antineoplastic radiation therapy: Secondary | ICD-10-CM | POA: Diagnosis not present

## 2017-12-09 ENCOUNTER — Ambulatory Visit
Admission: RE | Admit: 2017-12-09 | Discharge: 2017-12-09 | Disposition: A | Discharge status: Home / Self Care | Payer: BLUE CROSS/BLUE SHIELD | Source: Ambulatory Visit | Attending: Radiation Oncology | Admitting: Radiation Oncology

## 2017-12-09 DIAGNOSIS — Z510 Encounter for antineoplastic radiation therapy: Secondary | ICD-10-CM | POA: Diagnosis not present

## 2017-12-10 ENCOUNTER — Ambulatory Visit: Payer: BLUE CROSS/BLUE SHIELD

## 2017-12-13 ENCOUNTER — Ambulatory Visit
Admission: RE | Admit: 2017-12-13 | Discharge: 2017-12-13 | Disposition: A | Discharge status: Home / Self Care | Payer: BLUE CROSS/BLUE SHIELD | Source: Ambulatory Visit | Attending: Radiation Oncology | Admitting: Radiation Oncology

## 2017-12-13 DIAGNOSIS — Z510 Encounter for antineoplastic radiation therapy: Secondary | ICD-10-CM | POA: Diagnosis not present

## 2017-12-15 ENCOUNTER — Ambulatory Visit
Admission: RE | Admit: 2017-12-15 | Discharge: 2017-12-15 | Disposition: A | Discharge status: Home / Self Care | Payer: BLUE CROSS/BLUE SHIELD | Source: Ambulatory Visit | Attending: Radiation Oncology | Admitting: Radiation Oncology

## 2017-12-15 DIAGNOSIS — Z510 Encounter for antineoplastic radiation therapy: Secondary | ICD-10-CM | POA: Diagnosis not present

## 2017-12-16 ENCOUNTER — Ambulatory Visit
Admission: RE | Admit: 2017-12-16 | Discharge: 2017-12-16 | Disposition: A | Discharge status: Home / Self Care | Payer: BLUE CROSS/BLUE SHIELD | Source: Ambulatory Visit | Attending: Radiation Oncology | Admitting: Radiation Oncology

## 2017-12-16 DIAGNOSIS — Z510 Encounter for antineoplastic radiation therapy: Secondary | ICD-10-CM | POA: Diagnosis not present

## 2017-12-17 ENCOUNTER — Ambulatory Visit
Admission: RE | Admit: 2017-12-17 | Discharge: 2017-12-17 | Disposition: A | Discharge status: Home / Self Care | Payer: BLUE CROSS/BLUE SHIELD | Source: Ambulatory Visit | Attending: Radiation Oncology | Admitting: Radiation Oncology

## 2017-12-17 DIAGNOSIS — Z510 Encounter for antineoplastic radiation therapy: Secondary | ICD-10-CM | POA: Diagnosis not present

## 2017-12-20 ENCOUNTER — Ambulatory Visit
Admission: RE | Admit: 2017-12-20 | Discharge: 2017-12-20 | Disposition: A | Discharge status: Home / Self Care | Payer: BLUE CROSS/BLUE SHIELD | Source: Ambulatory Visit | Attending: Radiation Oncology | Admitting: Radiation Oncology

## 2017-12-20 DIAGNOSIS — Z510 Encounter for antineoplastic radiation therapy: Secondary | ICD-10-CM | POA: Diagnosis not present

## 2017-12-22 ENCOUNTER — Ambulatory Visit
Admission: RE | Admit: 2017-12-22 | Discharge: 2017-12-22 | Disposition: A | Discharge status: Home / Self Care | Payer: BLUE CROSS/BLUE SHIELD | Source: Ambulatory Visit | Attending: Radiation Oncology | Admitting: Radiation Oncology

## 2017-12-22 DIAGNOSIS — Z510 Encounter for antineoplastic radiation therapy: Secondary | ICD-10-CM | POA: Diagnosis not present

## 2017-12-23 ENCOUNTER — Ambulatory Visit
Admission: RE | Admit: 2017-12-23 | Discharge: 2017-12-23 | Disposition: A | Discharge status: Home / Self Care | Payer: BLUE CROSS/BLUE SHIELD | Source: Ambulatory Visit | Attending: Radiation Oncology | Admitting: Radiation Oncology

## 2017-12-23 DIAGNOSIS — Z510 Encounter for antineoplastic radiation therapy: Secondary | ICD-10-CM | POA: Diagnosis not present

## 2017-12-24 ENCOUNTER — Ambulatory Visit
Admission: RE | Admit: 2017-12-24 | Discharge: 2017-12-24 | Disposition: A | Discharge status: Home / Self Care | Payer: BLUE CROSS/BLUE SHIELD | Source: Ambulatory Visit | Attending: Radiation Oncology | Admitting: Radiation Oncology

## 2017-12-24 DIAGNOSIS — Z170 Estrogen receptor positive status [ER+]: Principal | ICD-10-CM

## 2017-12-24 DIAGNOSIS — Z510 Encounter for antineoplastic radiation therapy: Secondary | ICD-10-CM | POA: Diagnosis not present

## 2017-12-24 DIAGNOSIS — C50411 Malignant neoplasm of upper-outer quadrant of right female breast: Secondary | ICD-10-CM

## 2017-12-27 ENCOUNTER — Ambulatory Visit
Admission: RE | Admit: 2017-12-27 | Discharge: 2017-12-27 | Disposition: A | Discharge status: Home / Self Care | Payer: BLUE CROSS/BLUE SHIELD | Source: Ambulatory Visit | Attending: Radiation Oncology | Admitting: Radiation Oncology

## 2017-12-27 DIAGNOSIS — Z510 Encounter for antineoplastic radiation therapy: Secondary | ICD-10-CM | POA: Diagnosis not present

## 2017-12-28 ENCOUNTER — Ambulatory Visit
Admission: RE | Admit: 2017-12-28 | Discharge: 2017-12-28 | Disposition: A | Discharge status: Home / Self Care | Payer: BLUE CROSS/BLUE SHIELD | Source: Ambulatory Visit | Attending: Radiation Oncology | Admitting: Radiation Oncology

## 2017-12-28 DIAGNOSIS — Z510 Encounter for antineoplastic radiation therapy: Secondary | ICD-10-CM | POA: Diagnosis not present

## 2017-12-29 ENCOUNTER — Ambulatory Visit
Admission: RE | Admit: 2017-12-29 | Discharge: 2017-12-29 | Disposition: A | Discharge status: Home / Self Care | Payer: BLUE CROSS/BLUE SHIELD | Source: Ambulatory Visit | Attending: Radiation Oncology | Admitting: Radiation Oncology

## 2017-12-29 DIAGNOSIS — Z510 Encounter for antineoplastic radiation therapy: Secondary | ICD-10-CM | POA: Diagnosis not present

## 2017-12-30 ENCOUNTER — Ambulatory Visit
Admission: RE | Admit: 2017-12-30 | Discharge: 2017-12-30 | Disposition: A | Discharge status: Home / Self Care | Payer: BLUE CROSS/BLUE SHIELD | Source: Ambulatory Visit | Attending: Radiation Oncology | Admitting: Radiation Oncology

## 2017-12-30 DIAGNOSIS — Z510 Encounter for antineoplastic radiation therapy: Secondary | ICD-10-CM | POA: Diagnosis not present

## 2017-12-31 ENCOUNTER — Ambulatory Visit
Admission: RE | Admit: 2017-12-31 | Discharge: 2017-12-31 | Disposition: A | Discharge status: Home / Self Care | Payer: BLUE CROSS/BLUE SHIELD | Source: Ambulatory Visit | Attending: Radiation Oncology | Admitting: Radiation Oncology

## 2017-12-31 ENCOUNTER — Ambulatory Visit: Payer: BLUE CROSS/BLUE SHIELD | Admitting: Radiation Oncology

## 2017-12-31 DIAGNOSIS — Z170 Estrogen receptor positive status [ER+]: Principal | ICD-10-CM

## 2017-12-31 DIAGNOSIS — Z510 Encounter for antineoplastic radiation therapy: Secondary | ICD-10-CM | POA: Diagnosis not present

## 2017-12-31 DIAGNOSIS — C50411 Malignant neoplasm of upper-outer quadrant of right female breast: Secondary | ICD-10-CM

## 2017-12-31 MED ORDER — RADIAPLEXRX EX GEL
Freq: Once | CUTANEOUS | Status: AC
  Administered 2017-12-31: 16:00:00 via TOPICAL

## 2018-01-03 ENCOUNTER — Ambulatory Visit
Admission: RE | Admit: 2018-01-03 | Discharge: 2018-01-03 | Disposition: A | Discharge status: Home / Self Care | Payer: BLUE CROSS/BLUE SHIELD | Source: Ambulatory Visit | Attending: Radiation Oncology | Admitting: Radiation Oncology

## 2018-01-03 DIAGNOSIS — Z510 Encounter for antineoplastic radiation therapy: Secondary | ICD-10-CM | POA: Diagnosis not present

## 2018-01-04 ENCOUNTER — Ambulatory Visit
Admission: RE | Admit: 2018-01-04 | Discharge: 2018-01-04 | Disposition: A | Discharge status: Home / Self Care | Payer: BLUE CROSS/BLUE SHIELD | Source: Ambulatory Visit | Attending: Radiation Oncology | Admitting: Radiation Oncology

## 2018-01-04 DIAGNOSIS — Z510 Encounter for antineoplastic radiation therapy: Secondary | ICD-10-CM | POA: Diagnosis not present

## 2018-01-05 ENCOUNTER — Ambulatory Visit
Admission: RE | Admit: 2018-01-05 | Discharge: 2018-01-05 | Disposition: A | Discharge status: Home / Self Care | Payer: BLUE CROSS/BLUE SHIELD | Source: Ambulatory Visit | Attending: Radiation Oncology | Admitting: Radiation Oncology

## 2018-01-05 DIAGNOSIS — C50411 Malignant neoplasm of upper-outer quadrant of right female breast: Secondary | ICD-10-CM

## 2018-01-05 DIAGNOSIS — Z170 Estrogen receptor positive status [ER+]: Principal | ICD-10-CM

## 2018-01-05 DIAGNOSIS — Z510 Encounter for antineoplastic radiation therapy: Secondary | ICD-10-CM | POA: Diagnosis not present

## 2018-01-05 MED ORDER — CARRASYN V WOUND DRESSING EX GEL: CUTANEOUS | 0 refills | Status: DC | PRN

## 2018-01-05 MED ORDER — SONAFINE EX EMUL
1.0000 "application " | Freq: Two times a day (BID) | CUTANEOUS | Status: DC
  Administered 2018-01-05: 1 via TOPICAL

## 2018-01-05 NOTE — Progress Notes (Signed)
Pt came around requesting more radiaplex. Upon assessment patient skin appeared with increased hyperpigmentation but no desquamation. Pt also reported dry itchy skin, inner most portion of right breast . Provided pt with sonafine cream instead of radiaplex. Directed upon use. Provided pt with Telfa dressing. Secured with briefs in an attempt to reduce friction. Also provided pt with hydrogel pads directed upon use. Pt verbalized understanding on all use.

## 2018-01-06 ENCOUNTER — Ambulatory Visit
Admission: RE | Admit: 2018-01-06 | Discharge: 2018-01-06 | Disposition: A | Discharge status: Home / Self Care | Payer: BLUE CROSS/BLUE SHIELD | Source: Ambulatory Visit | Attending: Radiation Oncology | Admitting: Radiation Oncology

## 2018-01-06 ENCOUNTER — Ambulatory Visit: Payer: BLUE CROSS/BLUE SHIELD

## 2018-01-06 DIAGNOSIS — Z510 Encounter for antineoplastic radiation therapy: Secondary | ICD-10-CM | POA: Diagnosis not present

## 2018-01-07 ENCOUNTER — Ambulatory Visit: Payer: BLUE CROSS/BLUE SHIELD

## 2018-01-07 ENCOUNTER — Ambulatory Visit
Admission: RE | Admit: 2018-01-07 | Discharge: 2018-01-07 | Disposition: A | Discharge status: Home / Self Care | Payer: BLUE CROSS/BLUE SHIELD | Source: Ambulatory Visit | Attending: Radiation Oncology | Admitting: Radiation Oncology

## 2018-01-07 DIAGNOSIS — Z510 Encounter for antineoplastic radiation therapy: Secondary | ICD-10-CM | POA: Diagnosis not present

## 2018-01-07 NOTE — Progress Notes (Signed)
[place]  Telephone:([phone]) [phone] [phone]  Clinic Follow Up Note   Patient Care Team: Patient, No Pcp Per as PCP - General ([place]) Truitt Merle, MD as Consulting Physician (Hematology) Stark Klein, MD as Consulting Physician (General Surgery) Kyung Rudd, MD as Consulting Physician (Radiation Oncology) 01/10/2018  CHIEF COMPLAINTS:  Malignant neoplasm of upper-outer quadrant of right breast in female, estrogen receptor positive   Oncology History   Cancer Staging Malignant neoplasm of upper-outer quadrant of right breast in female, estrogen receptor positive ([place]) Staging form: Breast, AJCC 8th Edition - Clinical stage from 08/11/2017: Stage IB (cT2, cN0, cM0, G2, ER: Positive, PR: Positive, HER2: Negative) - Signed by Truitt Merle, MD on 08/17/2017       Malignant neoplasm of upper-outer quadrant of right breast in female, estrogen receptor positive ([place])   08/06/2017 Mammogram    IMPRESSION: 1. Palpable 2.1 cm mass in the 11 o'clock retroareolar right breast is highly suspicious for malignancy. 2. 1.3 cm markedly hypoechoic lymph node in the 10 o'clock position of the right breast axillary tail is indeterminate. Involvement with metastatic carcinoma cannot be excluded. 3. No suspicious lymph nodes are identified within the right axilla. 4. Extremely dense breast parenchyma. If breast biopsy is positive for malignancy, breast MRI is suggested. 5. No evidence of malignancy in the left breast.       08/11/2017 Receptors her2    Estrogen Receptor: 100%, POSITIVE, STRONG STAINING INTENSITY Progesterone Receptor: 100%, POSITIVE, STRONG STAINING INTENSITY Proliferation Marker Ki67: 10% HER2 NEGATIVE       08/11/2017 Initial Biopsy    Diagnosis 1. Breast, right, needle core biopsy, 11:00 o'clock - INVASIVE AND IN SITU LOBULAR CARCINOMA. - SEE COMMENT. 2. Breast, right, needle core biopsy, 10:00 o'clock lymph node - FIBROCYSTIC  CHANGES. - LYMPH NODAL TISSUE IS NOT IDENTIFIED. - THERE IS NO EVIDENCE OF MALIGNANCY.      08/17/2017 Initial Diagnosis    Malignant neoplasm of upper-outer quadrant of right breast in female, estrogen receptor positive ([place])      08/29/2017 Genetic Testing    Ms. Sliwinski underwent genetic counseling and testing for hereditary cancer syndromes on 08/19/2017. 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   She was tested for 53 genes on the common hereditary cancer panel and the melanoma panel.  The Hereditary Gene Panel offered by [facility] includes sequencing and/or deletion duplication testing of the following 46 genes: APC, ATM, AXIN2, BARD1, BMPR1A, BRCA1, BRCA2, BRIP1, CDH1, CDKN2A (p14ARF), CDKN2A (p16INK4a), CHEK2, CTNNA1, DICER1, EPCAM (Deletion/duplication testing only), GREM1 (promoter region deletion/duplication testing only), KIT, MEN1, MLH1, MSH2, MSH3, MSH6, MUTYH, NBN, NF1, NHTL1, PALB2, PDGFRA, PMS2, POLD1, POLE, PTEN, RAD50, RAD51C, RAD51D, SDHB, SDHC, SDHD, SMAD4, SMARCA4. STK11, TP53, TSC1, TSC2, and VHL.  The following genes were evaluated for sequence changes only: SDHA and HOXB13 c.251G>A variant only.  The Melanoma panel offered by [facility] includes sequencing and/or deletion duplication testing of the following 12 genes: BAP1, BRCA1, BRCA2, BRIP1, CDK4, CDKN2A (p14ARF), CDKN2A (p16INK4a), MC1R, POT1, PTEN, RB1, TERT, and TP53.  The following gene was evaluated for sequence changes only: MITF (c.952G>A, p.GLU318Lys variant only). The report date is September 02, 2017.       10/07/2017 Surgery    1. Breast, simple mastectomy, Left - PSEUDOANGIOMATOUS STROMAL HYPERPLASIA ([place]). - FIBROCYSTIC CHANGE. - USUAL DUCTAL HYPERPLASIA. - NO MALIGNANCY IDENTIFIED. 2. Lymph node, sentinel, biopsy, Right Axillary #1 - FIBROADENOMA. - NO LYMPHOID TISSUE. - NO MALIGNANCY IDENTIFIED. 3. Lymph node,  sentinel, biopsy, Right - ONE OF ONE LYMPH NODES  IS NEGATIVE FOR CARCINOMA (0/1). 4. Lymph node, sentinel, biopsy, Right - ONE OF ONE LYMPH NODES IS NEGATIVE FOR CARCINOMA (0/1). 5. Lymph node, sentinel, biopsy, Right - MICROMETASTASIS IN ONE OF ONE LYMPH NODES (1/1). 6. Lymph node, sentinel, biopsy, Right - ONE OF ONE LYMPH NODES IS NEGATIVE FOR CARCINOMA (0/1). 7. Lymph node, sentinel, biopsy, Right Axillary #2 - ONE OF ONE LYMPH NODES IS NEGATIVE FOR CARCINOMA (0/1). 8. Lymph node, sentinel, biopsy, Right - ONE OF ONE LYMPH NODES IS NEGATIVE FOR CARCINOMA (0/1). 9. Lymph node, sentinel, biopsy, Right Axillary #3 - ONE OF ONE LYMPH NODES IS NEGATIVE FOR CARCINOMA (0/1). 10. Lymph node, sentinel, biopsy, Right Axillary #4 - ONE OF ONE LYMPH NODES IS NEGATIVE FOR CARCINOMA (0/1). 11. Lymph node, sentinel, biopsy, Right Axillary #5 - ONE OF ONE LYMPH NODES IS NEGATIVE FOR CARCINOMA (0/1). 12. Lymph node, sentinel, biopsy, Right - ONE OF ONE LYMPH NODES IS NEGATIVE FOR CARCINOMA (0/1). 13. Breast, simple mastectomy, Right - INVASIVE LOBULAR CARCINOMA, GRADE 2, SPANNING 3.0 CM. - LOBULAR NEOPLASIA (LOBULAR CARCINOMA IN SITU). - INVASIVE CARCINOMA IS BROADLY PRESENT AT THE POSTERIOR MARGIN. - SEE ONCOLOGY TABLE.  Oncotype Recurrence score: 18      10/07/2017 Pathology Results    Diagnosis 10/07/18 1. Breast, simple mastectomy, Left - PSEUDOANGIOMATOUS STROMAL HYPERPLASIA ([place]). - FIBROCYSTIC CHANGE. - USUAL DUCTAL HYPERPLASIA. - NO MALIGNANCY IDENTIFIED. 2. Lymph node, sentinel, biopsy, Right Axillary #1 - FIBROADENOMA. - NO LYMPHOID TISSUE. - NO MALIGNANCY IDENTIFIED. 3. Lymph node, sentinel, biopsy, Right - ONE OF ONE LYMPH NODES IS NEGATIVE FOR CARCINOMA (0/1). 4. Lymph node, sentinel, biopsy, Right - ONE OF ONE LYMPH NODES IS NEGATIVE FOR CARCINOMA (0/1). 5. Lymph node, sentinel, biopsy, Right - MICROMETASTASIS IN ONE OF ONE LYMPH NODES (1/1). 6. Lymph node, sentinel, biopsy, Right - ONE OF ONE LYMPH NODES IS NEGATIVE  FOR CARCINOMA (0/1). 7. Lymph node, sentinel, biopsy, Right Axillary #2 - ONE OF ONE LYMPH NODES IS NEGATIVE FOR CARCINOMA (0/1). 8. Lymph node, sentinel, biopsy, Right - ONE OF ONE LYMPH NODES IS NEGATIVE FOR CARCINOMA (0/1). 9. Lymph node, sentinel, biopsy, Right Axillary #3 - ONE OF ONE LYMPH NODES IS NEGATIVE FOR CARCINOMA (0/1). 10. Lymph node, sentinel, biopsy, Right Axillary #4 - ONE OF ONE LYMPH NODES IS NEGATIVE FOR CARCINOMA (0/1). 11. Lymph node, sentinel, biopsy, Right Axillary #5 - ONE OF ONE LYMPH NODES IS NEGATIVE FOR CARCINOMA (0/1). 12. Lymph node, sentinel, biopsy, Right - ONE OF ONE LYMPH NODES IS NEGATIVE FOR CARCINOMA (0/1). 1 of 4 FINAL for KANDY, TOWERY ERICA [phone]) Diagnosis(continued) 13. Breast, simple mastectomy, Right - INVASIVE LOBULAR CARCINOMA, GRADE 2, SPANNING 3.0 CM. - LOBULAR NEOPLASIA (LOBULAR CARCINOMA IN SITU). - INVASIVE CARCINOMA IS BROADLY PRESENT AT THE POSTERIOR MARGIN. - SEE ONCOLOGY TABLE.       10/22/2017 Pathology Results    Soft tissue, debridement, left mastectomy flap - ULCERATED SKIN AND SUBCUTANEOUS TISSUE WITH NECROSIS AND ACUTE INFLAMMATION - NO MALIGNANCY IDENTIFIED      11/25/2017 -  Radiation Therapy    Per Dr. Lisbeth Renshaw        HISTORY OF PRESENTING ILLNESS: 08/18/17  Ramond Marrow 42 y.o. female is here because of newly diagnosed right breast cancer. She presents to the clinic today with her mother and father.  She felt the lump 2 months ago and she does not think it has changed since then. She had not had mammogram before.  She did not notice in change in weight, appetite, body or energy.   In the past she was diagnosed with bipolar 1 disorder. She is on medication and sees a psychiatrist since 2003. She has not had any hospitalization. She is ADHD as well and uses V[employer]. She had surgery on her left wrist and has a place placed in it. Her uncle had hodgkin's lymphoma and melanoma.   Today she reports She  quit smoking 2 days ago. Her husband is a smoker. 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 She does not know if she plans on having kids. She reports to having a brother in [place] and her parents are in near by.    GYN HISTORY  Menarchal: 13 LMP: 08/17/17 Contraceptive: IUD 12 years ago HRT: No GP: G4P0A4, 3 abortions and 1 miscarriage   CURRENT THERAPY 10 mg of Tamoxifen daily. Started radiation therapy with Dr. Lisbeth Renshaw on 11/25/17, will be completed on 01/13/18  INTERVAL HISTORY: Margot Oriordan returns for follow up. She is accompanied by her husband. She reports that she is doing well overall. She will complete radiation therapy with Dr. Lisbeth Renshaw on 01/13/18. She has increased redness and skin irritation in the area. She has fatigue and notes that exercise has helped. She has not been taking Tamoxifen. She expresses concern because she is considering having a child. She is having regular periods.   Since last visit, she underwent a bilateral nipple sparing mastectomy with Dr. Barry Dienes with immediate reconstruction with tissue expanders per Dr. Iran Planas on 10/07/17.  On review of systems, pt denies any new pain, or any other complaints at this time. Pt denies abdominal pain, nausea, vomiting. Pertinent positives are listed and detailed within the above HPI.   MEDICAL HISTORY:  Past Medical History:  Diagnosis Date  . ADHD   . Bipolar 1 disorder ([place])   . Cancer ([place])   . Depression     SURGICAL HISTORY: Past Surgical History:  Procedure Laterality Date  . BREAST RECONSTRUCTION WITH PLACEMENT OF TISSUE EXPANDER AND FLEX HD (ACELLULAR HYDRATED DERMIS) Bilateral 10/07/2017   Procedure: BREAST RECONSTRUCTION WITH PLACEMENT OF TISSUE EXPANDER AND ALLODERM;  Surgeon: Irene Limbo, MD;  Location: [facility];  Service: Plastics;  Laterality: Bilateral;  . INCISION AND DRAINAGE OF WOUND Left 10/22/2017   Procedure: DEBRIDEMENT OF LEFT MASTECTOMY FLAP;  Surgeon:  Irene Limbo, MD;  Location: [facility];  Service: Plastics;  Laterality: Left;  . NIPPLE SPARING MASTECTOMY/SENTINAL LYMPH NODE BIOPSY/RECONSTRUCTION/PLACEMENT OF TISSUE EXPANDER Bilateral 10/07/2017   Procedure: BILATERAL NIPPLE SPARING MASTECTOMY WITH RIGHT SENTINEL LYMPH NODE BIOPSY;  Surgeon: Stark Klein, MD;  Location: [facility];  Service: General;  Laterality: Bilateral;  . ROOT CANAL     3  . WRIST SURGERY Left     SOCIAL HISTORY: Social History   Socioeconomic History  . Marital status: Married    Spouse name: Not on file  . Number of children: Not on file  . Years of education: Not on file  . Highest education level: Not on file  Social Needs  . Financial resource strain: Not on file  . Food insecurity - worry: Not on file  . Food insecurity - inability: Not on file  . Transportation needs - medical: Not on file  . Transportation needs - non-medical: Not on file  Occupational History  . Not on file  Tobacco Use  . Smoking status: Former Smoker    Packs/day: 0.50  Years: 17.00    Pack years: 8.50    Types: Cigarettes    Last attempt to quit: 09/07/2017    Years since quitting: 0.3  . Smokeless tobacco: Never Used  Substance and Sexual Activity  . Alcohol use: No  . Drug use: Yes    Types: Other-see comments    Comment: socially/occas/ shrooms - last time about a month ago from 09/30/17  . Sexual activity: Not on file  Other Topics Concern  . Not on file  Social History Narrative  . Not on file    FAMILY HISTORY: Family History  Problem Relation Age of Onset  . Lymphoma Paternal Uncle 19  . Melanoma Paternal Uncle 38    ALLERGIES:  is allergic to macrobid [nitrofurantoin macrocrystal] and bee venom.  MEDICATIONS:  Current Outpatient Medications  Medication Sig Dispense Refill  . carbamazepine (CARBATROL) 100 MG 12 hr capsule Take 200 mg by mouth at bedtime.    Nyoka Lint Quai 500 MG CAPS Take 500 mg at bedtime as needed by mouth.    .  escitalopram (LEXAPRO) 10 MG tablet Take 10 mg by mouth at bedtime.     . hyaluronate sodium (RADIAPLEXRX) GEL Apply 1 application topically 2 (two) times daily.    Marland Kitchen ibuprofen (ADVIL,MOTRIN) 200 MG tablet Take 400 mg by mouth every 8 (eight) hours as needed for cramping.    . lisdexamfetamine (VYVANSE) 50 MG capsule Take 50 mg by mouth daily.    . Magnesium Gluconate 550 MG TABS Take 30 mg by mouth daily.    . Multiple Vitamin (MULTIVITAMIN) tablet Take 1 tablet by mouth daily.    . non-metallic deodorant Jethro Poling) MISC Apply 1 application topically daily.    . vitamin C (ASCORBIC ACID) 500 MG tablet Take 500 mg by mouth daily.    Marland Kitchen wound dressings gel Apply topically as needed for wound care. 15 g 0   No current facility-administered medications for this visit.     REVIEW OF SYSTEMS:   Constitutional: Denies fevers, chills or abnormal night sweats Eyes: Denies blurriness of vision, double vision or watery eyes Ears, nose, mouth, throat, and face: Denies mucositis or sore throat Respiratory: Denies cough, dyspnea or wheezes Cardiovascular: Denies palpitation, chest discomfort or lower extremity swelling Gastrointestinal:  Denies nausea, heartburn or change in bowel habits Skin: Denies abnormal skin rashes Lymphatics: Denies new lymphadenopathy or easy bruising Neurological:Denies numbness, tingling or new weaknesses Behavioral/Psych: Mood is stable, no new changes  Breast: (+) palpable mass in right breast  All other systems were reviewed with the patient and are negative.  PHYSICAL EXAMINATION: ECOG PERFORMANCE STATUS: 0 - Asymptomatic Weight 128.8 pounds, temperature 98.1, heart rate 90, respiration 20, blood pressure 123/78, pulse ox 100% on room air GENERAL:alert, no distress and comfortable SKIN: skin color, texture, turgor are normal, no rashes or significant lesions EYES: normal, conjunctiva are pink and non-injected, sclera clear OROPHARYNX:no exudate, no erythema and lips,  buccal mucosa, and tongue normal  NECK: supple, thyroid normal size, non-tender, without nodularity LYMPH:  no palpable lymphadenopathy in the cervical, axillary or inguinal LUNGS: clear to auscultation and percussion with normal breathing effort HEART: regular rate & rhythm and no murmurs and no lower extremity edema ABDOMEN:abdomen soft, non-tender and normal bowel sounds Musculoskeletal:no cyanosis of digits and no clubbing  PSYCH: alert & oriented x 3 with fluent speech NEURO: no focal motor/sensory deficits Breasts: Breast inspection showed them to be symmetrical with no nipple discharge. Palpation of the breasts and axilla revealed  no obvious mass that I could appreciate except a (+) palpable mass 1x2 cm in 11:00 position in upper right breast next to areola, moveable  LABORATORY DATA:  I have reviewed the data as listed CBC Latest Ref Rng & Units 09/30/2017 08/18/2017  WBC 4.0 - 10.5 K/uL 5.7 4.0  Hemoglobin 12.0 - 15.0 g/dL 13.5 13.7  Hematocrit 36.0 - 46.0 % 40.1 40.7  Platelets 150 - 400 K/uL 222 232    CMP Latest Ref Rng & Units 09/30/2017 08/18/2017  Glucose 65 - 99 mg/dL 96 92  BUN 6 - 20 mg/dL 18 19.5  Creatinine 0.44 - 1.00 mg/dL 0.74 0.8  Sodium 135 - 145 mmol/L 137 142  Potassium 3.5 - 5.1 mmol/L 4.2 4.4  Chloride 101 - 111 mmol/L 100(L) -  CO2 22 - 32 mmol/L 27 28  Calcium 8.9 - 10.3 mg/dL 9.4 9.4  Total Protein 6.5 - 8.1 g/dL 6.7 6.9  Total Bilirubin 0.3 - 1.2 mg/dL 0.7 0.48  Alkaline Phos 38 - 126 U/L 47 51  AST 15 - 41 U/L 24 18  ALT 14 - 54 U/L 18 18     PATHOLOGY  Diagnosis 08/11/17 1. Breast, right, needle core biopsy, 11:00 o'clock - INVASIVE AND IN SITU MAMMARY CARCINOMA. - SEE COMMENT. 2. Breast, right, needle core biopsy, 10:00 o'clock lymph node - FIBROCYSTIC CHANGES. - LYMPH NODAL TISSUE IS NOT IDENTIFIED. - THERE IS NO EVIDENCE OF MALIGNANCY. - SEE COMMENT. Microscopic Comment 1. , 2. The carcinoma appears grade II. An E-cadherin and a  breast prognostic profile will be performed and the results reported separately. The results were called to [facility] on 08/12/2017. (JBK:ecj 08/12/2017) 1. PROGNOSTIC INDICATORS Results: IMMUNOHISTOCHEMICAL AND MORPHOMETRIC ANALYSIS PERFORMED MANUALLY Estrogen Receptor: 100%, POSITIVE, STRONG STAINING INTENSITY Progesterone Receptor: 100%, POSITIVE, STRONG STAINING INTENSITY Proliferation Marker Ki67: 10% 1. FLUORESCENCE IN-SITU HYBRIDIZATION Results: HER2 - NEGATIVE RATIO OF HER2/CEP17 SIGNALS 1.41 AVERAGE HER2 COPY NUMBER PER CELL 2.00  Diagnosis 10/07/18 1. Breast, simple mastectomy, Left - PSEUDOANGIOMATOUS STROMAL HYPERPLASIA ([place]). - FIBROCYSTIC CHANGE. - USUAL DUCTAL HYPERPLASIA. - NO MALIGNANCY IDENTIFIED. 2. Lymph node, sentinel, biopsy, Right Axillary #1 - FIBROADENOMA. - NO LYMPHOID TISSUE. - NO MALIGNANCY IDENTIFIED. 3. Lymph node, sentinel, biopsy, Right - ONE OF ONE LYMPH NODES IS NEGATIVE FOR CARCINOMA (0/1). 4. Lymph node, sentinel, biopsy, Right - ONE OF ONE LYMPH NODES IS NEGATIVE FOR CARCINOMA (0/1). 5. Lymph node, sentinel, biopsy, Right - MICROMETASTASIS IN ONE OF ONE LYMPH NODES (1/1). 6. Lymph node, sentinel, biopsy, Right - ONE OF ONE LYMPH NODES IS NEGATIVE FOR CARCINOMA (0/1). 7. Lymph node, sentinel, biopsy, Right Axillary #2 - ONE OF ONE LYMPH NODES IS NEGATIVE FOR CARCINOMA (0/1). 8. Lymph node, sentinel, biopsy, Right - ONE OF ONE LYMPH NODES IS NEGATIVE FOR CARCINOMA (0/1). 9. Lymph node, sentinel, biopsy, Right Axillary #3 - ONE OF ONE LYMPH NODES IS NEGATIVE FOR CARCINOMA (0/1). 10. Lymph node, sentinel, biopsy, Right Axillary #4 - ONE OF ONE LYMPH NODES IS NEGATIVE FOR CARCINOMA (0/1). 11. Lymph node, sentinel, biopsy, Right Axillary #5 - ONE OF ONE LYMPH NODES IS NEGATIVE FOR CARCINOMA (0/1). 12. Lymph node, sentinel, biopsy, Right - ONE OF ONE LYMPH NODES IS NEGATIVE FOR CARCINOMA (0/1). 1 of 4 FINAL for AMAYIAH, GOSNELL ERICA [phone]) Diagnosis(continued) 13. Breast, simple mastectomy, Right - INVASIVE LOBULAR CARCINOMA, GRADE 2, SPANNING 3.0 CM. - LOBULAR NEOPLASIA (LOBULAR CARCINOMA IN SITU). - INVASIVE CARCINOMA IS BROADLY PRESENT AT THE POSTERIOR MARGIN. - SEE  ONCOLOGY TABLE. Microscopic Comment 13. BREAST, INVASIVE TUMOR Procedure: Right simple mastectomy with right axillary sentinel lymph nodes. Laterality: Right. Tumor Size: 3.0 cm. Histologic Type: Invasive lobular carcinoma. Grade: 2 Tubular Differentiation: 3 Nuclear Pleomorphism: 2 Mitotic Count: 1 Ductal Carcinoma in Situ (DCIS): Not identified. Extent of Tumor: Confined to breast. Margins: Invasive carcinoma, distance from closest margin: Broadly present at posterior margin. DCIS, distance from closest margin: N/A. Regional Lymph Nodes: Number of Lymph Nodes Examined: 10 Number of Sentinel Lymph Nodes Examined: 10 Lymph Nodes with Macrometastases: 0 Lymph Nodes with Micrometastases: 1 Lymph Nodes with Isolated Tumor Cells: 0 Breast Prognostic Profile: Performed on biopsy [id number], see below. Will not be repeated. Estrogen Receptor: Positive, 100% strong staining. Progesterone Receptor: Positive, 100% strong staining. Her2: Negative (ratio 1.41). Ki-67: 10% Best tumor block for sendout testing: 13C Pathologic Stage Classification (pTNM, AJCC 8th Edition): Primary Tumor (pT): pT2 Regional Lymph Nodes (pN): pN1(mi) Distant Metastases (pM): pMX Comments: Pancytokeratin is performed on the lymph nodes. Deeper levels and cytokeratin are performed on select blocks.  Diagnosis 10/22/17 Soft tissue, debridement, left mastectomy flap - ULCERATED SKIN AND SUBCUTANEOUS TISSUE WITH NECROSIS AND ACUTE INFLAMMATION - NO MALIGNANCY IDENTIFIED  RADIOGRAPHIC STUDIES: I have personally reviewed the radiological images as listed and agreed with the findings in the report. No results found.  ASSESSMENT & PLAN:  Kierstan Auer is a 42 y.o. female with a history of ADHD and Bipolar 1 disorders.    1. Malignant neoplasm of upper-outer quadrant of right breast, invasive lobular carcinoma, pT2N73mM0, Stage IB, ER100%+, PR100%+, HER2: Negative, Grade 2, (+) LCIS  -We discussed her imaging findings and the biopsy results in great details. -She underwent a bilateral nipple sparing mastectomy with Dr. BBarry Dieneswith immediate reconstruction with tissue expanders per Dr. TIran Planason 10/07/17. -we previously reviewed surgical pathology and oncotype recurrence score in detail. RS is 18, intermediate risk. Considering this and that her tumor is lobular histology and less sensitive to chemotherapy, I did not recommending adjuvant chemo.  -Her posterior surgical margin was positive for invasive carcinoma and she has micrometastasis in 1 SLN, adjuvant radiation was recommended, and she is finishing soon. -Her tumor is strongly ER/PR positive, and I strongly recommend adjuvant endocrine therapy to reduce her risk of recurrence, especially risk of distant recurrence, given her intermediate risk disease.  She is premenopausal, I recommend her to start tamoxifen.  we also discussed the alternative option of for ovarian suppression and aromatase inhibitor as adjuvant therapy. -However she very much wants to be pregnant and have her own child in the near future.  She got married last year.  She was accompanied by her husband today also.  We discussed that pregnancy is not a high risk for breast cancer recurrence, however she would not be able to take tamoxifen if she plans to be pregnant.  If she does get pregnant and deliver her baby next year, she still can go on adjuvant tamoxifen afterwards.  She will think about this.  -F/u in 3-4 months   2. Genetic Testing -Due to her young age I suggest genetic testing to see if she has any gene mutations. She agreed. -I will refer her to genetics -Ms. LBrissetteunderwent genetic counseling and  testing for hereditary cancer syndromes on 08/19/2017. 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   She was tested for 53 genes on the common hereditary cancer panel and the melanoma panel.  The [facility]  includes sequencing and/or deletion duplication testing of the following 46 genes: APC, ATM, AXIN2, BARD1, BMPR1A, BRCA1, BRCA2, BRIP1, CDH1, CDKN2A (p14ARF), CDKN2A (p16INK4a), CHEK2, CTNNA1, DICER1, EPCAM (Deletion/duplication testing only), GREM1 (promoter region deletion/duplication testing only), KIT, MEN1, MLH1, MSH2, MSH3, MSH6, MUTYH, NBN, NF1, NHTL1, PALB2, PDGFRA, PMS2, POLD1, POLE, PTEN, RAD50, RAD51C, RAD51D, SDHB, SDHC, SDHD, SMAD4, SMARCA4. STK11, TP53, TSC1, TSC2, and VHL.  The following genes were evaluated for sequence changes only: SDHA and HOXB13 c.251G>A variant only.  The Melanoma panel offered by [facility] includes sequencing and/or deletion duplication testing of the following 12 genes: BAP1, BRCA1, BRCA2, BRIP1, CDK4, CDKN2A (p14ARF), CDKN2A (p16INK4a), MC1R, POT1, PTEN, RB1, TERT, and TP53.  The following gene was evaluated for sequence changes only: MITF (c.952G>A, p.GLU318Lys variant only). The report date is September 02, 2017.   3. Smoking cessation -She stopped smoking 2 days ago but would like to stop completely. Her husband is also a smoker. She had smoked for 19 years, half a pack daily.  -I suggest she see her PCP or Psychiatrist for medication if needed  -I will also refer her to [facility] cessation program.   4. Bipolar  -Stable. We'll continue medication. She'll follow-up with her psychiatrist  PLAN:  -F/u in 3-4 months -She will consider Tamoxifen vs getting pregnancy soon. Will hold on adjuvant Tamoxifen for now.     No orders of the defined types were placed in this encounter.   All questions were answered. The patient knows to call the clinic with any problems, questions or  concerns. I spent 20 minutes counseling the patient face to face. The total time spent in the appointment was 30 minutes and more than 50% was on counseling.  This document serves as a record of services personally performed by Truitt Merle, MD. It was created on her behalf by Theresia Bough, a trained medical scribe. The creation of this record is based on the scribe's personal observations and the provider's statements to them.   I have reviewed the above documentation for accuracy and completeness, and I agree with the above.     Truitt Merle, MD 01/10/2018

## 2018-01-10 ENCOUNTER — Telehealth: Payer: Self-pay | Admitting: Hematology

## 2018-01-10 ENCOUNTER — Ambulatory Visit
Admission: RE | Admit: 2018-01-10 | Discharge: 2018-01-10 | Disposition: A | Discharge status: Home / Self Care | Payer: BLUE CROSS/BLUE SHIELD | Source: Ambulatory Visit | Attending: Radiation Oncology | Admitting: Radiation Oncology

## 2018-01-10 ENCOUNTER — Inpatient Hospital Stay: Payer: BLUE CROSS/BLUE SHIELD | Attending: Hematology | Admitting: Hematology

## 2018-01-10 DIAGNOSIS — Z85820 Personal history of malignant melanoma of skin: Secondary | ICD-10-CM

## 2018-01-10 DIAGNOSIS — N6011 Diffuse cystic mastopathy of right breast: Secondary | ICD-10-CM | POA: Insufficient documentation

## 2018-01-10 DIAGNOSIS — Z1502 Genetic susceptibility to malignant neoplasm of ovary: Secondary | ICD-10-CM

## 2018-01-10 DIAGNOSIS — R5383 Other fatigue: Secondary | ICD-10-CM

## 2018-01-10 DIAGNOSIS — T508X5S Adverse effect of diagnostic agents, sequela: Secondary | ICD-10-CM

## 2018-01-10 DIAGNOSIS — Z9013 Acquired absence of bilateral breasts and nipples: Secondary | ICD-10-CM | POA: Diagnosis not present

## 2018-01-10 DIAGNOSIS — F909 Attention-deficit hyperactivity disorder, unspecified type: Secondary | ICD-10-CM | POA: Diagnosis not present

## 2018-01-10 DIAGNOSIS — Z79810 Long term (current) use of selective estrogen receptor modulators (SERMs): Secondary | ICD-10-CM | POA: Insufficient documentation

## 2018-01-10 DIAGNOSIS — L270 Generalized skin eruption due to drugs and medicaments taken internally: Secondary | ICD-10-CM | POA: Diagnosis not present

## 2018-01-10 DIAGNOSIS — Z1501 Genetic susceptibility to malignant neoplasm of breast: Secondary | ICD-10-CM | POA: Diagnosis not present

## 2018-01-10 DIAGNOSIS — F17210 Nicotine dependence, cigarettes, uncomplicated: Secondary | ICD-10-CM | POA: Diagnosis not present

## 2018-01-10 DIAGNOSIS — Z1589 Genetic susceptibility to other disease: Secondary | ICD-10-CM

## 2018-01-10 DIAGNOSIS — Z807 Family history of other malignant neoplasms of lymphoid, hematopoietic and related tissues: Secondary | ICD-10-CM | POA: Diagnosis not present

## 2018-01-10 DIAGNOSIS — F319 Bipolar disorder, unspecified: Secondary | ICD-10-CM | POA: Diagnosis not present

## 2018-01-10 DIAGNOSIS — Z923 Personal history of irradiation: Secondary | ICD-10-CM | POA: Diagnosis not present

## 2018-01-10 DIAGNOSIS — Z79899 Other long term (current) drug therapy: Secondary | ICD-10-CM

## 2018-01-10 DIAGNOSIS — Z87891 Personal history of nicotine dependence: Secondary | ICD-10-CM | POA: Diagnosis not present

## 2018-01-10 DIAGNOSIS — Z1509 Genetic susceptibility to other malignant neoplasm: Secondary | ICD-10-CM

## 2018-01-10 DIAGNOSIS — Z170 Estrogen receptor positive status [ER+]: Secondary | ICD-10-CM | POA: Diagnosis not present

## 2018-01-10 DIAGNOSIS — C50411 Malignant neoplasm of upper-outer quadrant of right female breast: Secondary | ICD-10-CM | POA: Diagnosis not present

## 2018-01-10 DIAGNOSIS — Z510 Encounter for antineoplastic radiation therapy: Secondary | ICD-10-CM | POA: Diagnosis not present

## 2018-01-10 NOTE — Telephone Encounter (Signed)
Scheduled appts per 1/21 los - patient did not want avs or calendar.

## 2018-01-11 ENCOUNTER — Ambulatory Visit
Admission: RE | Admit: 2018-01-11 | Discharge: 2018-01-11 | Disposition: A | Discharge status: Home / Self Care | Payer: BLUE CROSS/BLUE SHIELD | Source: Ambulatory Visit | Attending: Radiation Oncology | Admitting: Radiation Oncology

## 2018-01-11 ENCOUNTER — Encounter: Payer: Self-pay | Admitting: Hematology

## 2018-01-11 DIAGNOSIS — Z510 Encounter for antineoplastic radiation therapy: Secondary | ICD-10-CM | POA: Diagnosis not present

## 2018-01-12 ENCOUNTER — Ambulatory Visit: Payer: BLUE CROSS/BLUE SHIELD

## 2018-01-12 ENCOUNTER — Ambulatory Visit
Admission: RE | Admit: 2018-01-12 | Discharge: 2018-01-12 | Disposition: A | Discharge status: Home / Self Care | Payer: BLUE CROSS/BLUE SHIELD | Source: Ambulatory Visit | Attending: Radiation Oncology | Admitting: Radiation Oncology

## 2018-01-12 DIAGNOSIS — Z510 Encounter for antineoplastic radiation therapy: Secondary | ICD-10-CM | POA: Diagnosis not present

## 2018-01-13 ENCOUNTER — Encounter: Payer: Self-pay | Admitting: Radiation Oncology

## 2018-01-13 ENCOUNTER — Ambulatory Visit
Admission: RE | Admit: 2018-01-13 | Discharge: 2018-01-13 | Disposition: A | Discharge status: Home / Self Care | Payer: BLUE CROSS/BLUE SHIELD | Source: Ambulatory Visit | Attending: Radiation Oncology | Admitting: Radiation Oncology

## 2018-01-13 ENCOUNTER — Ambulatory Visit: Payer: BLUE CROSS/BLUE SHIELD

## 2018-01-13 DIAGNOSIS — Z510 Encounter for antineoplastic radiation therapy: Secondary | ICD-10-CM | POA: Diagnosis not present

## 2018-01-13 DIAGNOSIS — Z170 Estrogen receptor positive status [ER+]: Principal | ICD-10-CM

## 2018-01-13 DIAGNOSIS — C50411 Malignant neoplasm of upper-outer quadrant of right female breast: Secondary | ICD-10-CM

## 2018-01-13 MED ORDER — RADIAPLEXRX EX GEL
Freq: Once | CUTANEOUS | Status: AC
  Administered 2018-01-13: 15:00:00 via TOPICAL

## 2018-01-20 NOTE — Progress Notes (Signed)
  Radiation Oncology         [phone]) [phone] ________________________________  Name: Dawn Ramos MRN: [id number]  Date: 01/13/2018  DOB: [birth year]  End of Treatment Note  Diagnosis:   42 y.o. 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 This was delivered using a 3-D conformal technique. The patient then received a boost to the surgical scar. This delivered an additional 10 Gy in 5 fractions using a 3D technique. The total dose was 60.4 Gy.  Narrative: The patient tolerated radiation treatment relatively well.   The patient had some expected fatigue and skin irritation as she progressed during treatment. Moist desquamation was not present at the end of treatment.  Plan: The patient has completed radiation treatment. The patient will return to radiation oncology clinic for routine followup in one month. I advised the patient to call or return sooner if they have any questions or concerns related to their recovery or treatment. ________________________________  Jodelle Gross, MD, PhD  This document serves as a record of services personally performed by Kyung Rudd, MD. It was created on his behalf by Rae Lips, a trained medical scribe. The creation of this record is based on the scribe's personal observations and the provider's statements to them. This document has been checked and approved by the attending provider.

## 2018-02-09 NOTE — Addendum Note (Signed)
Encounter addended by: Kyung Rudd, MD on: 02/09/2018 8:39 AM  Actions taken: Visit diagnoses modified, Sign clinical note

## 2018-02-09 NOTE — Progress Notes (Signed)
Simulation note The patient was brought to the treatment room for simulation for the patient's upcoming electron treatment. The patient was setup in the treatment position and the target region was delineated. The patient will receive treatment to the right breast/ chest wall using an en face electron field. One customized block/complex treatment device has been constructed for this purpose, and this will be used on a daily basis during the patient's treatment. After appropriate set up was confirmed, skin markings were placed to allow accurate targeting of the treatment area during the patient's course of therapy.  ------------------------------------------------  Jodelle Gross, MD, PhD

## 2018-02-14 ENCOUNTER — Encounter: Payer: Self-pay | Admitting: Radiation Oncology

## 2018-02-14 ENCOUNTER — Other Ambulatory Visit: Payer: Self-pay

## 2018-02-14 ENCOUNTER — Ambulatory Visit
Admission: RE | Admit: 2018-02-14 | Discharge: 2018-02-14 | Disposition: A | Discharge status: Home / Self Care | Payer: BLUE CROSS/BLUE SHIELD | Source: Ambulatory Visit | Attending: Radiation Oncology | Admitting: Radiation Oncology

## 2018-02-14 VITALS — BP 123/87 | HR 120 | Temp 97.8°F | Resp 18 | Ht 67.0 in | Wt 121.6 lb

## 2018-02-14 DIAGNOSIS — Z170 Estrogen receptor positive status [ER+]: Principal | ICD-10-CM

## 2018-02-14 DIAGNOSIS — C50411 Malignant neoplasm of upper-outer quadrant of right female breast: Secondary | ICD-10-CM

## 2018-02-14 DIAGNOSIS — Z510 Encounter for antineoplastic radiation therapy: Secondary | ICD-10-CM | POA: Diagnosis not present

## 2018-02-14 NOTE — Progress Notes (Signed)
Radiation Oncology         [id number]) [phone] ________________________________  Name: Dawn Ramos MRN: [id number]  Date of Service: 02/14/2018  DOB: Feb 21, 1976  Post Treatment Note  CC: Patient, No Pcp Per  Truitt Merle, MD  Diagnosis:  Stage IB,pT2N29miM0 grade 2 ER/PR positive invasivelobularcarcinoma of the right breast    Interval Since Last Radiation: 5 weeks   11/25/2017 - 01/13/2018: The patient initially received a dose of 50.4 Gy in 28 fractions to the right chest wall and supraclavicular region using whole-breast tangent fields. This was delivered using a 3-D conformal technique. The patient then received a boost to the surgical scar. This delivered an additional 10 Gy in 5 fractions using a 3D technique. The total dose was 60.4 Gy.   Narrative:  The patient returns today for routine follow-up. During treatment she did very well with radiotherapy and did not have significant desquamation.                             On review of systems, the patient states she's doing great. She denies any concerns with her skin but reports it's mildly hyperpigmented and tan. No other complaints are noted.  ALLERGIES:  is allergic to macrobid [nitrofurantoin macrocrystal] and bee venom.  Meds: Current Outpatient Medications  Medication Sig Dispense Refill  . carbamazepine (CARBATROL) 100 MG 12 hr capsule Take 200 mg by mouth at bedtime.    Nyoka Lint Quai 500 MG CAPS Take 500 mg at bedtime as needed by mouth.    . escitalopram (LEXAPRO) 10 MG tablet Take 10 mg by mouth at bedtime.     Marland Kitchen ibuprofen (ADVIL,MOTRIN) 200 MG tablet Take 400 mg by mouth every 8 (eight) hours as needed for cramping.    . lisdexamfetamine (VYVANSE) 50 MG capsule Take 50 mg by mouth daily.    . Magnesium Gluconate 550 MG TABS Take 30 mg by mouth daily.    . Multiple Vitamin (MULTIVITAMIN) tablet Take 1 tablet by mouth daily.    . vitamin C (ASCORBIC ACID) 500 MG tablet Take 500 mg by mouth daily.    Marland Kitchen wound  dressings gel Apply topically as needed for wound care. (Patient not taking: Reported on 02/14/2018) 15 g 0   No current facility-administered medications for this encounter.     Physical Findings:  height is 5\' 7"  (1.702 m) and weight is 121 lb 9.6 oz (55.2 kg). Her oral temperature is 97.8 F (36.6 C). Her blood pressure is 123/87 and her pulse is 120 (abnormal). Her respiration is 18 and oxygen saturation is 100%.  Pain Assessment Pain Score: 0-No pain/10 In general this is a well appearing caucasian female in no acute distress. She's alert and oriented x4 and appropriate throughout the examination. Cardiopulmonary assessment is negative for acute distress and she exhibits normal effort. The right breast was examined and reveals hyperpigmentation without desquamation. In situ bilateral breast expanders were noted.   Lab Findings: Lab Results  Component Value Date   WBC 5.7 09/30/2017   HGB 13.5 09/30/2017   HCT 40.1 09/30/2017   MCV 91.3 09/30/2017   PLT 222 09/30/2017     Radiographic Findings: No results found.  Impression/Plan: 1. Stage IB,pT2N61miM0 grade 2 ER/PR positive invasivelobularcarcinoma of the right breast. The patient has been doing well since completion of radiotherapy. We discussed that we would be happy to continue to follow her as needed, but she will also continue to follow  up with Dr. Burr Medico in medical oncology. She was counseled on skin care as well as measures to avoid sun exposure to this area.  2. Survivorship. The patient will follow up in April with Dr. Burr Medico and will be set up for survivorship clinic at the appropriate time.     Carola Rhine, PAC

## 2018-04-01 ENCOUNTER — Telehealth: Payer: Self-pay | Admitting: Hematology

## 2018-04-01 NOTE — Telephone Encounter (Signed)
PAL - moved f/u 4/25 to 5/10. Left message for patient and mailed schedule.

## 2018-04-14 ENCOUNTER — Ambulatory Visit: Payer: BLUE CROSS/BLUE SHIELD | Admitting: Hematology

## 2018-04-26 NOTE — Progress Notes (Signed)
[place]  Telephone:([phone]) [phone] Fax:([phone]) [phone]  Clinic Follow Up Note   Patient Care Team: Patient, No Pcp Per as PCP - General ([place]) Truitt Merle, MD as Consulting Physician (Hematology) Stark Klein, MD as Consulting Physician (General Surgery) Kyung Rudd, MD as Consulting Physician (Radiation Oncology) 04/29/2018  CHIEF COMPLAINTS:  Malignant neoplasm of upper-outer quadrant of right breast in female, estrogen receptor positive   Oncology History   Cancer Staging Malignant neoplasm of upper-outer quadrant of right breast in female, estrogen receptor positive ([place]) Staging form: Breast, AJCC 8th Edition - Clinical stage from 08/11/2017: Stage IB (cT2, cN0, cM0, G2, ER: Positive, PR: Positive, HER2: Negative) - Signed by Truitt Merle, MD on 08/17/2017       Malignant neoplasm of upper-outer quadrant of right breast in female, estrogen receptor positive ([place]) (Resolved)   08/06/2017 Mammogram    IMPRESSION: 1. Palpable 2.1 cm mass in the 11 o'clock retroareolar right breast is highly suspicious for malignancy. 2. 1.3 cm markedly hypoechoic lymph node in the 10 o'clock position of the right breast axillary tail is indeterminate. Involvement with metastatic carcinoma cannot be excluded. 3. No suspicious lymph nodes are identified within the right axilla. 4. Extremely dense breast parenchyma. If breast biopsy is positive for malignancy, breast MRI is suggested. 5. No evidence of malignancy in the left breast.       08/11/2017 Receptors her2    Estrogen Receptor: 100%, POSITIVE, STRONG STAINING INTENSITY Progesterone Receptor: 100%, POSITIVE, STRONG STAINING INTENSITY Proliferation Marker Ki67: 10% HER2 NEGATIVE       08/11/2017 Initial Biopsy    Diagnosis 1. Breast, right, needle core biopsy, 11:00 o'clock - INVASIVE AND IN SITU LOBULAR CARCINOMA. - SEE COMMENT. 2. Breast, right, needle core biopsy, 10:00 o'clock lymph node -  FIBROCYSTIC CHANGES. - LYMPH NODAL TISSUE IS NOT IDENTIFIED. - THERE IS NO EVIDENCE OF MALIGNANCY.      08/17/2017 Initial Diagnosis    Malignant neoplasm of upper-outer quadrant of right breast in female, estrogen receptor positive ([place])      08/29/2017 Genetic Testing    Dawn Ramos underwent genetic counseling and testing for hereditary cancer syndromes on 08/19/2017. 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   She was tested for 53 genes on the common hereditary cancer panel and the melanoma panel.  The Hereditary Gene Panel offered by [facility] includes sequencing and/or deletion duplication testing of the following 46 genes: APC, ATM, AXIN2, BARD1, BMPR1A, BRCA1, BRCA2, BRIP1, CDH1, CDKN2A (p14ARF), CDKN2A (p16INK4a), CHEK2, CTNNA1, DICER1, EPCAM (Deletion/duplication testing only), GREM1 (promoter region deletion/duplication testing only), KIT, MEN1, MLH1, MSH2, MSH3, MSH6, MUTYH, NBN, NF1, NHTL1, PALB2, PDGFRA, PMS2, POLD1, POLE, PTEN, RAD50, RAD51C, RAD51D, SDHB, SDHC, SDHD, SMAD4, SMARCA4. STK11, TP53, TSC1, TSC2, and VHL.  The following genes were evaluated for sequence changes only: SDHA and HOXB13 c.251G>A variant only.  The Melanoma panel offered by [facility] includes sequencing and/or deletion duplication testing of the following 12 genes: BAP1, BRCA1, BRCA2, BRIP1, CDK4, CDKN2A (p14ARF), CDKN2A (p16INK4a), MC1R, POT1, PTEN, RB1, TERT, and TP53.  The following gene was evaluated for sequence changes only: MITF (c.952G>A, p.GLU318Lys variant only). The report date is September 02, 2017.       10/07/2017 Surgery    1. Breast, simple mastectomy, Left - PSEUDOANGIOMATOUS STROMAL HYPERPLASIA ([place]). - FIBROCYSTIC CHANGE. - USUAL DUCTAL HYPERPLASIA. - NO MALIGNANCY IDENTIFIED. 2. Lymph node, sentinel, biopsy, Right Axillary #1 - FIBROADENOMA. - NO LYMPHOID TISSUE. - NO MALIGNANCY IDENTIFIED. 3. Lymph  node, sentinel, biopsy, Right - ONE OF ONE  LYMPH NODES IS NEGATIVE FOR CARCINOMA (0/1). 4. Lymph node, sentinel, biopsy, Right - ONE OF ONE LYMPH NODES IS NEGATIVE FOR CARCINOMA (0/1). 5. Lymph node, sentinel, biopsy, Right - MICROMETASTASIS IN ONE OF ONE LYMPH NODES (1/1). 6. Lymph node, sentinel, biopsy, Right - ONE OF ONE LYMPH NODES IS NEGATIVE FOR CARCINOMA (0/1). 7. Lymph node, sentinel, biopsy, Right Axillary #2 - ONE OF ONE LYMPH NODES IS NEGATIVE FOR CARCINOMA (0/1). 8. Lymph node, sentinel, biopsy, Right - ONE OF ONE LYMPH NODES IS NEGATIVE FOR CARCINOMA (0/1). 9. Lymph node, sentinel, biopsy, Right Axillary #3 - ONE OF ONE LYMPH NODES IS NEGATIVE FOR CARCINOMA (0/1). 10. Lymph node, sentinel, biopsy, Right Axillary #4 - ONE OF ONE LYMPH NODES IS NEGATIVE FOR CARCINOMA (0/1). 11. Lymph node, sentinel, biopsy, Right Axillary #5 - ONE OF ONE LYMPH NODES IS NEGATIVE FOR CARCINOMA (0/1). 12. Lymph node, sentinel, biopsy, Right - ONE OF ONE LYMPH NODES IS NEGATIVE FOR CARCINOMA (0/1). 13. Breast, simple mastectomy, Right - INVASIVE LOBULAR CARCINOMA, GRADE 2, SPANNING 3.0 CM. - LOBULAR NEOPLASIA (LOBULAR CARCINOMA IN SITU). - INVASIVE CARCINOMA IS BROADLY PRESENT AT THE POSTERIOR MARGIN. - SEE ONCOLOGY TABLE.  Oncotype Recurrence score: 18      10/07/2017 Pathology Results    Diagnosis 10/07/18 1. Breast, simple mastectomy, Left - PSEUDOANGIOMATOUS STROMAL HYPERPLASIA ([place]). - FIBROCYSTIC CHANGE. - USUAL DUCTAL HYPERPLASIA. - NO MALIGNANCY IDENTIFIED. 2. Lymph node, sentinel, biopsy, Right Axillary #1 - FIBROADENOMA. - NO LYMPHOID TISSUE. - NO MALIGNANCY IDENTIFIED. 3. Lymph node, sentinel, biopsy, Right - ONE OF ONE LYMPH NODES IS NEGATIVE FOR CARCINOMA (0/1). 4. Lymph node, sentinel, biopsy, Right - ONE OF ONE LYMPH NODES IS NEGATIVE FOR CARCINOMA (0/1). 5. Lymph node, sentinel, biopsy, Right - MICROMETASTASIS IN ONE OF ONE LYMPH NODES (1/1). 6. Lymph node, sentinel, biopsy, Right - ONE OF ONE LYMPH NODES  IS NEGATIVE FOR CARCINOMA (0/1). 7. Lymph node, sentinel, biopsy, Right Axillary #2 - ONE OF ONE LYMPH NODES IS NEGATIVE FOR CARCINOMA (0/1). 8. Lymph node, sentinel, biopsy, Right - ONE OF ONE LYMPH NODES IS NEGATIVE FOR CARCINOMA (0/1). 9. Lymph node, sentinel, biopsy, Right Axillary #3 - ONE OF ONE LYMPH NODES IS NEGATIVE FOR CARCINOMA (0/1). 10. Lymph node, sentinel, biopsy, Right Axillary #4 - ONE OF ONE LYMPH NODES IS NEGATIVE FOR CARCINOMA (0/1). 11. Lymph node, sentinel, biopsy, Right Axillary #5 - ONE OF ONE LYMPH NODES IS NEGATIVE FOR CARCINOMA (0/1). 12. Lymph node, sentinel, biopsy, Right - ONE OF ONE LYMPH NODES IS NEGATIVE FOR CARCINOMA (0/1). 1 of 4 FINAL for Dawn Ramos, Dawn Ramos [phone]) Diagnosis(continued) 13. Breast, simple mastectomy, Right - INVASIVE LOBULAR CARCINOMA, GRADE 2, SPANNING 3.0 CM. - LOBULAR NEOPLASIA (LOBULAR CARCINOMA IN SITU). - INVASIVE CARCINOMA IS BROADLY PRESENT AT THE POSTERIOR MARGIN. - SEE ONCOLOGY TABLE.       10/22/2017 Pathology Results    Soft tissue, debridement, left mastectomy flap - ULCERATED SKIN AND SUBCUTANEOUS TISSUE WITH NECROSIS AND ACUTE INFLAMMATION - NO MALIGNANCY IDENTIFIED      11/25/2017 -  Radiation Therapy    Per Dr. Lisbeth Renshaw        HISTORY OF PRESENTING ILLNESS: 08/18/17  Dawn Ramos 42 y.o. female is here because of newly diagnosed right breast cancer. She presents to the clinic today with her mother and father.  She felt the lump 2 months ago and she does not think it has changed since then. She had not had mammogram  before. She did not notice in change in weight, appetite, body or energy.   In the past she was diagnosed with bipolar 1 disorder. She is on medication and sees a psychiatrist since 2003. She has not had any hospitalization. She is ADHD as well and uses Vyvance. She had surgery on her left wrist and has a place placed in it. Her uncle had hodgkin's lymphoma and melanoma.   Today she  reports She quit smoking 2 days ago. Her husband is a smoker. 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 She does not know if she plans on having kids. She reports to having a brother in [place] and her parents are in near by.    GYN HISTORY  Menarchal: 13 LMP: 08/17/17 Contraceptive: IUD 12 years ago HRT: No GP: G4P0A4, 3 abortions and 1 miscarriage   CURRENT THERAPY 10 mg of Tamoxifen daily. Started radiation therapy with Dr. Lisbeth Renshaw on 11/25/17, will be completed on 01/13/18  INTERVAL HISTORY: Dawn Ramos returns for follow up. She is accompanied by her husband. She notes that she is doing well overall.   She hasn't gone to physical therapy and she notes that she has been working on raising her right arm more on her own. She notes that she has been trying to become pregnant and she hasn't seen a gynecologist as of yet for help regarding this.   On review of systems, she reports red spot between her breasts, fatigue. she denies hot flashes, pain, lack of appetite, and any other symptoms.    MEDICAL HISTORY:  Past Medical History:  Diagnosis Date  . ADHD   . Bipolar 1 disorder ([place])   . Cancer ([place])   . Depression     SURGICAL HISTORY: Past Surgical History:  Procedure Laterality Date  . BREAST RECONSTRUCTION WITH PLACEMENT OF TISSUE EXPANDER AND FLEX HD (ACELLULAR HYDRATED DERMIS) Bilateral 10/07/2017   Procedure: BREAST RECONSTRUCTION WITH PLACEMENT OF TISSUE EXPANDER AND ALLODERM;  Surgeon: Irene Limbo, MD;  Location: [facility];  Service: Plastics;  Laterality: Bilateral;  . INCISION AND DRAINAGE OF WOUND Left 10/22/2017   Procedure: DEBRIDEMENT OF LEFT MASTECTOMY FLAP;  Surgeon: Irene Limbo, MD;  Location: [facility];  Service: Plastics;  Laterality: Left;  . NIPPLE SPARING MASTECTOMY/SENTINAL LYMPH NODE BIOPSY/RECONSTRUCTION/PLACEMENT OF TISSUE EXPANDER Bilateral 10/07/2017   Procedure: BILATERAL NIPPLE SPARING MASTECTOMY  WITH RIGHT SENTINEL LYMPH NODE BIOPSY;  Surgeon: Stark Klein, MD;  Location: [facility];  Service: General;  Laterality: Bilateral;  . ROOT CANAL     3  . WRIST SURGERY Left     SOCIAL HISTORY: Social History   Socioeconomic History  . Marital status: Married    Spouse name: Not on file  . Number of children: Not on file  . Years of education: Not on file  . Highest education level: Not on file  Occupational History  . Not on file  Social Needs  . Financial resource strain: Not on file  . Food insecurity:    Worry: Not on file    Inability: Not on file  . Transportation needs:    Medical: Not on file    Non-medical: Not on file  Tobacco Use  . Smoking status: Former Smoker    Packs/day: 0.50    Years: 17.00    Pack years: 8.50    Types: Cigarettes    Last attempt to quit: 09/07/2017    Years since quitting: 0.6  . Smokeless tobacco:  Never Used  Substance and Sexual Activity  . Alcohol use: No  . Drug use: Yes    Types: Other-see comments    Comment: socially/occas/ shrooms - last time about a month ago from 09/30/17  . Sexual activity: Not on file  Lifestyle  . Physical activity:    Days per week: Not on file    Minutes per session: Not on file  . Stress: Not on file  Relationships  . Social connections:    Talks on phone: Not on file    Gets together: Not on file    Attends religious service: Not on file    Active member of club or organization: Not on file    Attends meetings of clubs or organizations: Not on file    Relationship status: Not on file  . Intimate partner violence:    Fear of current or ex partner: Not on file    Emotionally abused: Not on file    Physically abused: Not on file    Forced sexual activity: Not on file  Other Topics Concern  . Not on file  Social History Narrative  . Not on file    FAMILY HISTORY: Family History  Problem Relation Age of Onset  . Lymphoma Paternal Uncle 73  . Melanoma Paternal Uncle 77    ALLERGIES:  is  allergic to macrobid [nitrofurantoin macrocrystal] and bee venom.  MEDICATIONS:  Current Outpatient Medications  Medication Sig Dispense Refill  . carbamazepine (TEGRETOL) 100 MG chewable tablet Chew 100 mg by mouth 2 (two) times daily.  1  . [place] 500 MG CAPS Take 500 mg at bedtime as needed by mouth.    . escitalopram (LEXAPRO) 10 MG tablet Take 10 mg by mouth at bedtime.     Marland Kitchen ibuprofen (ADVIL,MOTRIN) 200 MG tablet Take 400 mg by mouth every 8 (eight) hours as needed for cramping.    . lisdexamfetamine (VYVANSE) 50 MG capsule Take 50 mg by mouth daily.    . Multiple Vitamin (MULTIVITAMIN) tablet Take 1 tablet by mouth daily.    Marland Kitchen wound dressings gel Apply topically as needed for wound care. (Patient not taking: Reported on 02/14/2018) 15 g 0   No current facility-administered medications for this visit.     REVIEW OF SYSTEMS:   Constitutional: Denies fevers, chills or abnormal night sweats Eyes: Denies blurriness of vision, double vision or watery eyes Ears, nose, mouth, throat, and face: Denies mucositis or sore throat Respiratory: Denies cough, dyspnea or wheezes Cardiovascular: Denies palpitation, chest discomfort or lower extremity swelling Gastrointestinal:  Denies nausea, heartburn or change in bowel habits Skin: Denies abnormal skin rashes Lymphatics: Denies new lymphadenopathy or easy bruising Neurological:Denies numbness, tingling or new weaknesses Behavioral/Psych: Mood is stable, no new changes  Breast: (+) palpable mass in right breast  All other systems were reviewed with the patient and are negative.  PHYSICAL EXAMINATION:  ECOG PERFORMANCE STATUS: 0 - Asymptomatic Weight 128.8 pounds, temperature 98.1, heart rate 90, respiration 20, blood pressure 123/78, pulse ox 100% on room air GENERAL:alert, no distress and comfortable SKIN: skin color, texture, turgor are normal, no rashes or significant lesions EYES: normal, conjunctiva are pink and non-injected, sclera  clear OROPHARYNX:no exudate, no erythema and lips, buccal mucosa, and tongue normal  NECK: supple, thyroid normal size, non-tender, without nodularity LYMPH:  no palpable lymphadenopathy in the cervical, axillary or inguinal LUNGS: clear to auscultation and percussion with normal breathing effort HEART: regular rate & rhythm and no murmurs and no  lower extremity edema ABDOMEN:abdomen soft, non-tender and normal bowel sounds Musculoskeletal:no cyanosis of digits and no clubbing  PSYCH: alert & oriented x 3 with fluent speech NEURO: no focal motor/sensory deficits Breasts: Breast inspection showed them to be s/p bilateral mastectomy with implant. Right breast has diffuse mild skin pigmentation and the redness from 3 o'clock towards the nipple but no significant lump or palpable mass or adenopathy. Right breast without any palpable mass or adenopathy.    LABORATORY DATA:  I have reviewed the data as listed CBC Latest Ref Rng & Units 09/30/2017 08/18/2017  WBC 4.0 - 10.5 K/uL 5.7 4.0  Hemoglobin 12.0 - 15.0 g/dL 13.5 13.7  Hematocrit 36.0 - 46.0 % 40.1 40.7  Platelets 150 - 400 K/uL 222 232    CMP Latest Ref Rng & Units 09/30/2017 08/18/2017  Glucose 65 - 99 mg/dL 96 92  BUN 6 - 20 mg/dL 18 19.5  Creatinine 0.44 - 1.00 mg/dL 0.74 0.8  Sodium 135 - 145 mmol/L 137 142  Potassium 3.5 - 5.1 mmol/L 4.2 4.4  Chloride 101 - 111 mmol/L 100(L) -  CO2 22 - 32 mmol/L 27 28  Calcium 8.9 - 10.3 mg/dL 9.4 9.4  Total Protein 6.5 - 8.1 g/dL 6.7 6.9  Total Bilirubin 0.3 - 1.2 mg/dL 0.7 0.48  Alkaline Phos 38 - 126 U/L 47 51  AST 15 - 41 U/L 24 18  ALT 14 - 54 U/L 18 18     PATHOLOGY  Diagnosis 08/11/17 1. Breast, right, needle core biopsy, 11:00 o'clock - INVASIVE AND IN SITU MAMMARY CARCINOMA. - SEE COMMENT. 2. Breast, right, needle core biopsy, 10:00 o'clock lymph node - FIBROCYSTIC CHANGES. - LYMPH NODAL TISSUE IS NOT IDENTIFIED. - THERE IS NO EVIDENCE OF MALIGNANCY. - SEE  COMMENT. Microscopic Comment 1. , 2. The carcinoma appears grade II. An E-cadherin and a breast prognostic profile will be performed and the results reported separately. The results were called to [facility] on 08/12/2017. (JBK:ecj 08/12/2017) 1. PROGNOSTIC INDICATORS Results: IMMUNOHISTOCHEMICAL AND MORPHOMETRIC ANALYSIS PERFORMED MANUALLY Estrogen Receptor: 100%, POSITIVE, STRONG STAINING INTENSITY Progesterone Receptor: 100%, POSITIVE, STRONG STAINING INTENSITY Proliferation Marker Ki67: 10% 1. FLUORESCENCE IN-SITU HYBRIDIZATION Results: HER2 - NEGATIVE RATIO OF HER2/CEP17 SIGNALS 1.41 AVERAGE HER2 COPY NUMBER PER CELL 2.00  Diagnosis 10/07/18 1. Breast, simple mastectomy, Left - PSEUDOANGIOMATOUS STROMAL HYPERPLASIA ([place]). - FIBROCYSTIC CHANGE. - USUAL DUCTAL HYPERPLASIA. - NO MALIGNANCY IDENTIFIED. 2. Lymph node, sentinel, biopsy, Right Axillary #1 - FIBROADENOMA. - NO LYMPHOID TISSUE. - NO MALIGNANCY IDENTIFIED. 3. Lymph node, sentinel, biopsy, Right - ONE OF ONE LYMPH NODES IS NEGATIVE FOR CARCINOMA (0/1). 4. Lymph node, sentinel, biopsy, Right - ONE OF ONE LYMPH NODES IS NEGATIVE FOR CARCINOMA (0/1). 5. Lymph node, sentinel, biopsy, Right - MICROMETASTASIS IN ONE OF ONE LYMPH NODES (1/1). 6. Lymph node, sentinel, biopsy, Right - ONE OF ONE LYMPH NODES IS NEGATIVE FOR CARCINOMA (0/1). 7. Lymph node, sentinel, biopsy, Right Axillary #2 - ONE OF ONE LYMPH NODES IS NEGATIVE FOR CARCINOMA (0/1). 8. Lymph node, sentinel, biopsy, Right - ONE OF ONE LYMPH NODES IS NEGATIVE FOR CARCINOMA (0/1). 9. Lymph node, sentinel, biopsy, Right Axillary #3 - ONE OF ONE LYMPH NODES IS NEGATIVE FOR CARCINOMA (0/1). 10. Lymph node, sentinel, biopsy, Right Axillary #4 - ONE OF ONE LYMPH NODES IS NEGATIVE FOR CARCINOMA (0/1). 11. Lymph node, sentinel, biopsy, Right Axillary #5 - ONE OF ONE LYMPH NODES IS NEGATIVE FOR CARCINOMA (0/1). 12. Lymph node, sentinel, biopsy,  Right -  ONE OF ONE LYMPH NODES IS NEGATIVE FOR CARCINOMA (0/1). 1 of 4 FINAL for Dawn, LINAREZ Ramos [phone]) Diagnosis(continued) 13. Breast, simple mastectomy, Right - INVASIVE LOBULAR CARCINOMA, GRADE 2, SPANNING 3.0 CM. - LOBULAR NEOPLASIA (LOBULAR CARCINOMA IN SITU). - INVASIVE CARCINOMA IS BROADLY PRESENT AT THE POSTERIOR MARGIN. - SEE ONCOLOGY TABLE. Microscopic Comment 13. BREAST, INVASIVE TUMOR Procedure: Right simple mastectomy with right axillary sentinel lymph nodes. Laterality: Right. Tumor Size: 3.0 cm. Histologic Type: Invasive lobular carcinoma. Grade: 2 Tubular Differentiation: 3 Nuclear Pleomorphism: 2 Mitotic Count: 1 Ductal Carcinoma in Situ (DCIS): Not identified. Extent of Tumor: Confined to breast. Margins: Invasive carcinoma, distance from closest margin: Broadly present at posterior margin. DCIS, distance from closest margin: N/A. Regional Lymph Nodes: Number of Lymph Nodes Examined: 10 Number of Sentinel Lymph Nodes Examined: 10 Lymph Nodes with Macrometastases: 0 Lymph Nodes with Micrometastases: 1 Lymph Nodes with Isolated Tumor Cells: 0 Breast Prognostic Profile: Performed on biopsy [id number], see below. Will not be repeated. Estrogen Receptor: Positive, 100% strong staining. Progesterone Receptor: Positive, 100% strong staining. Her2: Negative (ratio 1.41). Ki-67: 10% Best tumor block for sendout testing: 13C Pathologic Stage Classification (pTNM, AJCC 8th Edition): Primary Tumor (pT): pT2 Regional Lymph Nodes (pN): pN1(mi) Distant Metastases (pM): pMX Comments: Pancytokeratin is performed on the lymph nodes. Deeper levels and cytokeratin are performed on select blocks.  Diagnosis 10/22/17 Soft tissue, debridement, left mastectomy flap - ULCERATED SKIN AND SUBCUTANEOUS TISSUE WITH NECROSIS AND ACUTE INFLAMMATION - NO MALIGNANCY IDENTIFIED  RADIOGRAPHIC STUDIES: I have personally reviewed the radiological images as listed and agreed  with the findings in the report. No results found.  ASSESSMENT & PLAN:  Dawn Ramos is a 42 y.o. female with a history of ADHD and Bipolar 1 disorders.    1. Malignant neoplasm of upper-outer quadrant of right breast, invasive lobular carcinoma, pT2N19mM0, Stage IB, ER100%+, PR100%+, HER2: Negative, Grade 2, (+) LCIS  -We discussed her imaging findings and the biopsy results in great details. -She underwent a bilateral nipple sparing mastectomy with Dr. BBarry Dieneswith immediate reconstruction with tissue expanders per Dr. TIran Planason 10/07/17. -we previously reviewed surgical pathology and oncotype recurrence score in detail. RS is 18, intermediate risk. Considering this and that her tumor is lobular histology and less sensitive to chemotherapy, I did not recommending adjuvant chemo.  -Her posterior surgical margin was positive for invasive carcinoma and she has micrometastasis in 1 SLN, she completed adjuvant radiation -Her tumor is strongly ER/PR positive, and I strongly recommend adjuvant endocrine therapy to reduce her risk of recurrence, especially risk of distant recurrence, given her intermediate risk disease.  She is premenopausal, I recommend her to start tamoxifen.  we also discussed the alternative option of for ovarian suppression and aromatase inhibitor as adjuvant therapy. -However she very much wants to be pregnant and have her own child in the near future.  She got married last year.  She was accompanied by her husband today also.  We discussed that pregnancy is not a high risk for breast cancer recurrence, however she would not be able to take tamoxifen if she plans to be pregnant.  If she does get pregnant and deliver her baby next year, she still can go on adjuvant tamoxifen afterwards.  She agrees with the plan.  -Her attempt to get pregnant has not been successful.  She wants to continue trying.  I her to see her gynecologist. -I will refer the patient to physical therapy  to aid in her ROM of  her right arm.  -I recommended that the patient follow up with her gynecologist to aid in pregnancy -We will hold adjuvant tamoxifen at this time until we discuss this again on next visit.  -Lab next week -Lab and f/u in 6 months    2. Genetic Testing -Due to her young age I suggest genetic testing to see if she has any gene mutations. She agreed. -I will refer her to genetics -Ms. Mogle underwent genetic counseling and testing for hereditary cancer syndromes on 08/19/2017. 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   She was tested for 53 genes on the common hereditary cancer panel and the melanoma panel.  The Hereditary Gene Panel offered by [facility] includes sequencing and/or deletion duplication testing of the following 46 genes: APC, ATM, AXIN2, BARD1, BMPR1A, BRCA1, BRCA2, BRIP1, CDH1, CDKN2A (p14ARF), CDKN2A (p16INK4a), CHEK2, CTNNA1, DICER1, EPCAM (Deletion/duplication testing only), GREM1 (promoter region deletion/duplication testing only), KIT, MEN1, MLH1, MSH2, MSH3, MSH6, MUTYH, NBN, NF1, NHTL1, PALB2, PDGFRA, PMS2, POLD1, POLE, PTEN, RAD50, RAD51C, RAD51D, SDHB, SDHC, SDHD, SMAD4, SMARCA4. STK11, TP53, TSC1, TSC2, and VHL.  The following genes were evaluated for sequence changes only: SDHA and HOXB13 c.251G>A variant only.  The Melanoma panel offered by [facility] includes sequencing and/or deletion duplication testing of the following 12 genes: BAP1, BRCA1, BRCA2, BRIP1, CDK4, CDKN2A (p14ARF), CDKN2A (p16INK4a), MC1R, POT1, PTEN, RB1, TERT, and TP53.  The following gene was evaluated for sequence changes only: MITF (c.952G>A, p.GLU318Lys variant only). The report date is September 02, 2017.   3. Smoking cessation -She stopped smoking 2 days ago but would like to stop completely. Her husband is also a smoker. She had smoked for 19 years, half a pack daily.  -I previously suggested that she see her PCP or Psychiatrist for  medication if needed  -I also previously referred her to [facility] program.   4. Bipolar  -Stable. We'll continue medication. She'll follow-up with her psychiatrist -she follows up with her psychiatrist every 3 months  PLAN:  -Lab next week -Lab and f/u in 6 months  -We will hold adjuvant tamoxifen at this time while she is trying to get pregnant, will discuss this again on next visit.      No orders of the defined types were placed in this encounter.   All questions were answered. The patient knows to call the clinic with any problems, questions or concerns. I spent 15 minutes counseling the patient face to face. The total time spent in the appointment was 20 minutes and more than 50% was on counseling.  This document serves as a record of services personally performed by Truitt Merle, MD. It was created on her behalf by Steva Colder, a trained medical scribe. The creation of this record is based on the scribe's personal observations and the provider's statements to them.   I have reviewed the above documentation for accuracy and completeness, and I agree with the above.    Truitt Merle, MD 04/29/2018

## 2018-04-29 ENCOUNTER — Inpatient Hospital Stay: Payer: BLUE CROSS/BLUE SHIELD | Attending: Hematology | Admitting: Hematology

## 2018-04-29 ENCOUNTER — Telehealth: Payer: Self-pay

## 2018-04-29 ENCOUNTER — Encounter: Payer: Self-pay | Admitting: Hematology

## 2018-04-29 VITALS — BP 112/74 | HR 100 | Temp 98.2°F | Resp 18 | Ht 67.0 in | Wt 121.7 lb

## 2018-04-29 DIAGNOSIS — Z1502 Genetic susceptibility to malignant neoplasm of ovary: Secondary | ICD-10-CM

## 2018-04-29 DIAGNOSIS — Z1379 Encounter for other screening for genetic and chromosomal anomalies: Secondary | ICD-10-CM

## 2018-04-29 DIAGNOSIS — Z79899 Other long term (current) drug therapy: Secondary | ICD-10-CM

## 2018-04-29 DIAGNOSIS — F319 Bipolar disorder, unspecified: Secondary | ICD-10-CM | POA: Insufficient documentation

## 2018-04-29 DIAGNOSIS — Z9011 Acquired absence of right breast and nipple: Secondary | ICD-10-CM | POA: Diagnosis not present

## 2018-04-29 DIAGNOSIS — Z1589 Genetic susceptibility to other disease: Secondary | ICD-10-CM

## 2018-04-29 DIAGNOSIS — Z87891 Personal history of nicotine dependence: Secondary | ICD-10-CM | POA: Diagnosis not present

## 2018-04-29 DIAGNOSIS — F909 Attention-deficit hyperactivity disorder, unspecified type: Secondary | ICD-10-CM | POA: Diagnosis not present

## 2018-04-29 DIAGNOSIS — Z807 Family history of other malignant neoplasms of lymphoid, hematopoietic and related tissues: Secondary | ICD-10-CM | POA: Diagnosis not present

## 2018-04-29 DIAGNOSIS — Z170 Estrogen receptor positive status [ER+]: Secondary | ICD-10-CM | POA: Diagnosis not present

## 2018-04-29 DIAGNOSIS — Z1501 Genetic susceptibility to malignant neoplasm of breast: Secondary | ICD-10-CM | POA: Diagnosis not present

## 2018-04-29 DIAGNOSIS — Z1509 Genetic susceptibility to other malignant neoplasm: Secondary | ICD-10-CM

## 2018-04-29 DIAGNOSIS — C50411 Malignant neoplasm of upper-outer quadrant of right female breast: Secondary | ICD-10-CM | POA: Diagnosis not present

## 2018-04-29 DIAGNOSIS — Z79810 Long term (current) use of selective estrogen receptor modulators (SERMs): Secondary | ICD-10-CM

## 2018-04-29 NOTE — Telephone Encounter (Signed)
Printed avs and calender of upcoming appointment. Per 5/10 los 

## 2018-05-05 ENCOUNTER — Other Ambulatory Visit: Payer: Self-pay | Admitting: Hematology

## 2018-05-05 DIAGNOSIS — Z170 Estrogen receptor positive status [ER+]: Principal | ICD-10-CM

## 2018-05-05 DIAGNOSIS — C50411 Malignant neoplasm of upper-outer quadrant of right female breast: Secondary | ICD-10-CM

## 2018-05-06 ENCOUNTER — Inpatient Hospital Stay: Payer: BLUE CROSS/BLUE SHIELD

## 2018-05-06 DIAGNOSIS — C50411 Malignant neoplasm of upper-outer quadrant of right female breast: Secondary | ICD-10-CM | POA: Diagnosis not present

## 2018-05-06 DIAGNOSIS — Z170 Estrogen receptor positive status [ER+]: Principal | ICD-10-CM

## 2018-05-06 LAB — CBC WITH DIFFERENTIAL (CANCER CENTER ONLY)
BASOS PCT: 1 %
Basophils Absolute: 0.1 10*3/uL (ref 0.0–0.1)
EOS ABS: 0.1 10*3/uL (ref 0.0–0.5)
Eosinophils Relative: 2 %
HCT: 37.6 % (ref 34.8–46.6)
Hemoglobin: 12.5 g/dL (ref 11.6–15.9)
Lymphocytes Relative: 24 %
Lymphs Abs: 1 10*3/uL (ref 0.9–3.3)
MCH: 29.7 pg (ref 25.1–34.0)
MCHC: 33.2 g/dL (ref 31.5–36.0)
MCV: 89.6 fL (ref 79.5–101.0)
MONO ABS: 0.3 10*3/uL (ref 0.1–0.9)
MONOS PCT: 7 %
Neutro Abs: 2.8 10*3/uL (ref 1.5–6.5)
Neutrophils Relative %: 66 %
Platelet Count: 267 10*3/uL (ref 145–400)
RBC: 4.2 MIL/uL (ref 3.70–5.45)
RDW: 13.5 % (ref 11.2–14.5)
WBC Count: 4.2 10*3/uL (ref 3.9–10.3)

## 2018-05-06 LAB — CMP (CANCER CENTER ONLY)
ALBUMIN: 4.1 g/dL (ref 3.5–5.0)
ALT: 15 U/L (ref 0–55)
ANION GAP: 7 (ref 3–11)
AST: 18 U/L (ref 5–34)
Alkaline Phosphatase: 69 U/L (ref 40–150)
BUN: 23 mg/dL (ref 7–26)
CO2: 29 mmol/L (ref 22–29)
CREATININE: 0.79 mg/dL (ref 0.60–1.10)
Calcium: 9.3 mg/dL (ref 8.4–10.4)
Chloride: 104 mmol/L (ref 98–109)
GFR, Estimated: >60 mL/min (ref >60)
GLUCOSE: 90 mg/dL (ref 70–140)
POTASSIUM: 3.8 mmol/L (ref 3.5–5.1)
SODIUM: 140 mmol/L (ref 136–145)
TOTAL PROTEIN: 6.7 g/dL (ref 6.4–8.3)
Total Bilirubin: 0.5 mg/dL (ref 0.2–1.2)

## 2018-10-28 ENCOUNTER — Inpatient Hospital Stay: Payer: BLUE CROSS/BLUE SHIELD

## 2018-10-28 ENCOUNTER — Inpatient Hospital Stay: Payer: BLUE CROSS/BLUE SHIELD | Attending: Hematology | Admitting: Hematology

## 2018-11-03 NOTE — H&P (Signed)
Subjective:     Patient ID: Dawn Ramos is a 42 y.o. female.  Follow-up    13 months post op bilateral NSM with prepectoral reconstruction. Course complicated by left  mastectomy flap necrosis post debridement including excision NAC. Scheduled for implant exchange and lipofilling bilateral next month.   Presented with palpable mass. This was first MMG. MMG with distortion UOQ right 2 cm. [place] with mass 11 o clock 2.1 x 1.5 x 1.1 cm, deep margin of the mass adjacent to the pectoralis muscle. A LN 1.3 cm imaged in the axillary tail of the right breast at 10 o'clock position; [place] axilla negative. Biopsy of mass with ILC, ER/PR+, Her2 -. Biopsy of 10 o clock LN with fibrocystic changes, no lymph tissue.MRI with known 2.1 cm mass UOQ and NME involving the upper inner quadrant. 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 Oncotype 18/12%. Completed right chest XRT 1.24.19.   Tamoxifen recommended but patient discussed desiring pregnancy and this was held. Her attempts at this not successful and not currently trying. She failed visit with Dr. Burr Medico this month.  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   Prior 34C, happy with this. Right mastectomy 316 g Left 440 g  Prior 0.5 ppd- states last cig was her consult visit date. Notes husband has also quit.  PMH significant for bipolar d/o.  Lives with husband. Works for [employer]. Avid biker.  Review of Systems     Objective:   Physical Exam  Cardiovascular: Normal rate, regular rhythm and normal heart sounds.  Pulmonary/Chest: Effort normal and breath sounds normal.   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 Options include proceed with implant exchange and fat grafting, implant exchange with LD flap for radiated chest, or conversion to autologous. Discussed LD flap incisions drains, function of this muscle. Patient has elected for implant exchange. She understands LD flap would be plan for salvage reconstruction of any complications- per patient if she had any complications she would likely just remove implants.  Plan removal bilateral TE and placement implants. Reviewed saline vs silicone. Counseled silicone may offer less visible rippling.Reviewed MRI or [place] surveillance with silicone implants for rupture. Reviewed breast implants are not permanent devices, may require additional surgery for rupture, contracture, infection, malposition or flipping.Reviewed purpose of texturing, incidence of ALCL with this, that she does have textured expanders, that this macrotexture has been removed from market. Plancapacity filled smooth round silicone.  Reviewed size guided by BW.  Discussed purpose fat grafting to minimize visible rippling, thicken mastectomy flaps. Discussed donor site pain and compression, reviewed variable take graft and fat necrosis that presents as masses or cysts. Plan medial thighs as donor sites. Asked her to buy compression garment and bring to surgery.  Plan OP surgery, no drains anticipated.  Reviewed additional risks including but not limited to seroma, hematoma, asymmetry, needforadditional procedures, DVT/PE, damage to adjacent structures,  cardiopulmonary complications.  Rx forBactrim, oxycodone given.  Natrelle 133FV-12-T 400 ml tissue  expanders placed bilateral,   total fill right 360 ml,   left 355 total fill saline  Irene Limbo, MD [facility] Plastic & Reconstructive Surgery [phone], pin [phone]

## 2018-11-04 ENCOUNTER — Encounter (HOSPITAL_BASED_OUTPATIENT_CLINIC_OR_DEPARTMENT_OTHER): Payer: Self-pay | Admitting: *Deleted

## 2018-11-04 ENCOUNTER — Other Ambulatory Visit: Payer: Self-pay

## 2018-11-04 NOTE — Pre-Procedure Instructions (Signed)
To come pick up CHG soap to use in shower night before surgery and AM DOS and Ensure pre-surgery drink 10 oz.(to drink by 0415 DOS).

## 2018-11-08 IMAGING — US ULTRASOUND RIGHT BREAST LIMITED
1 series · 11 of 11 positions shown · non-contrast
Comparison: None

ADDENDUM:
RECOMMENDATION: Ultrasound-guided biopsies of the palpable right
breast mass and of the axillary tail intramammary lymph node. These
biopsies are being scheduled for the patient's convenience.
CLINICAL DATA: 40-year-old patient presents for evaluation of a
palpable mass that she has noticed over the past 2 months or so in
the upper-outer retroareolar right breast. She feels the mass best
when she is sitting up with her right arm up. The mass is painless.
Her mother has a recent history of breast cancer.

EXAM:
2D DIGITAL DIAGNOSTIC BILATERAL MAMMOGRAM WITH CAD AND ADJUNCT TOMO
ULTRASOUND RIGHT BREAST

[Series 1: ultrasound right breast limited · 0.07mm/px · 11 of 11 slices shown]
[im 1/11]
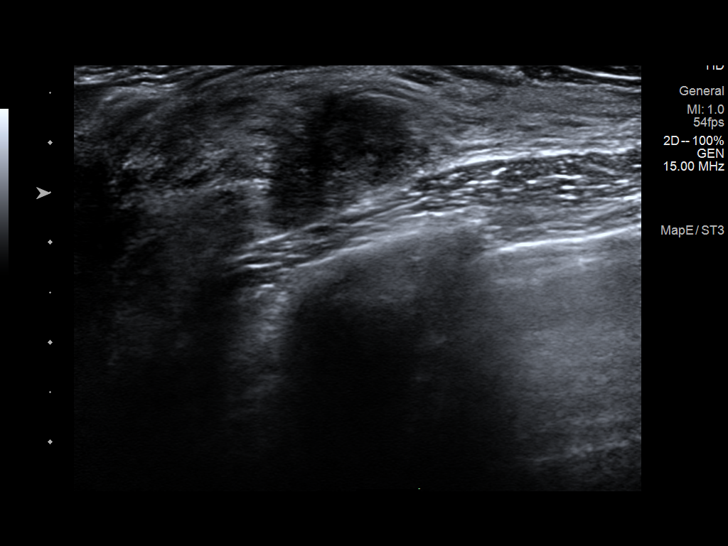
[im 2/11]
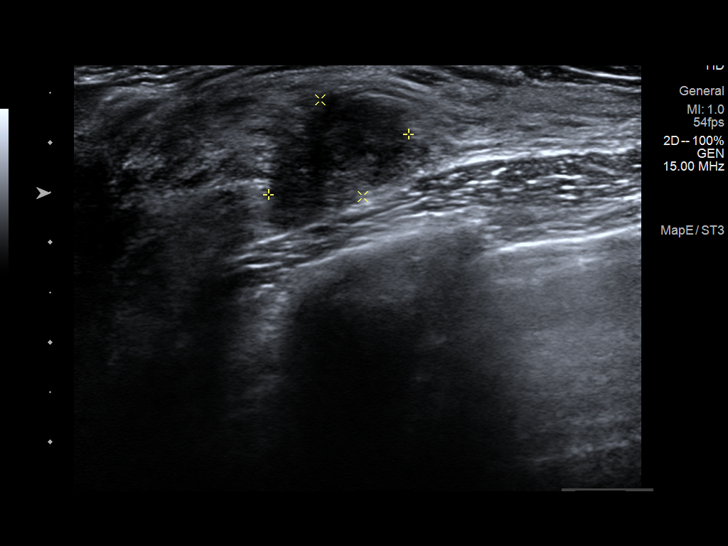
[im 3/11]
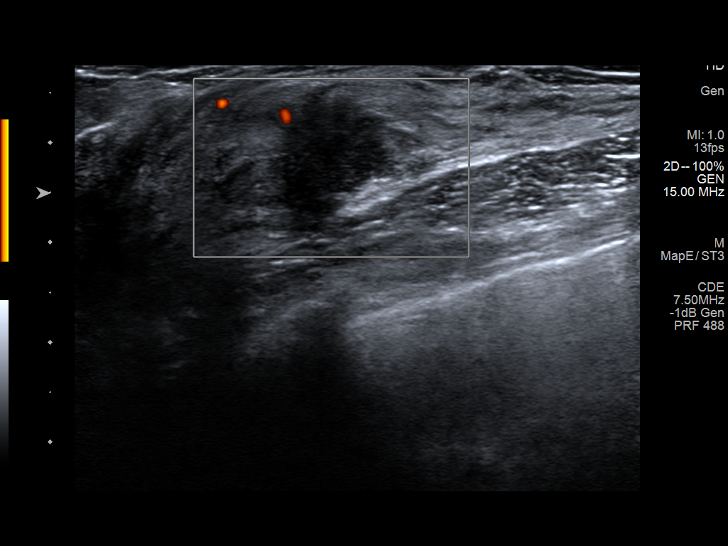
[im 4/11]
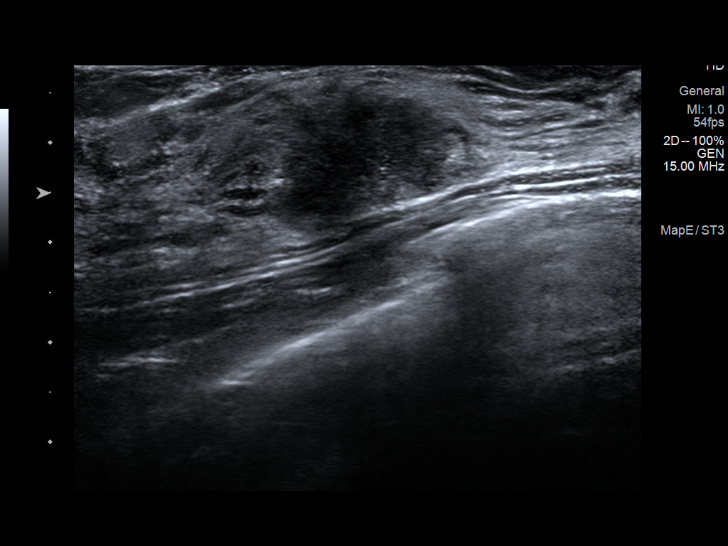
[im 5/11]
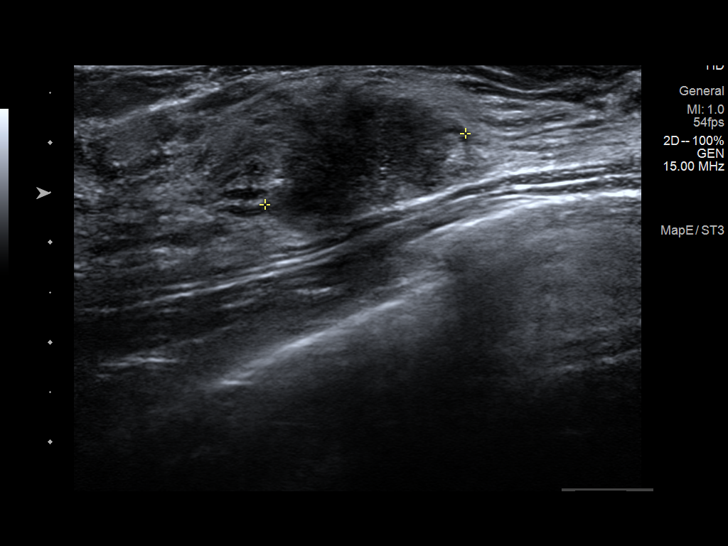
[im 6/11]
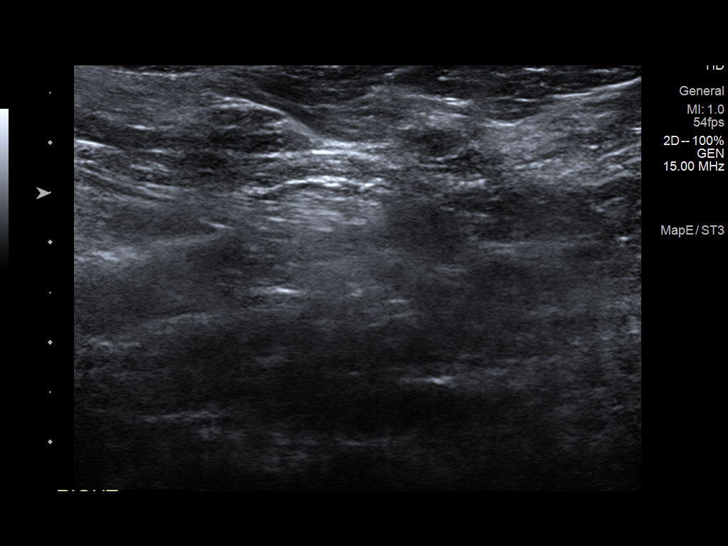
[im 7/11]
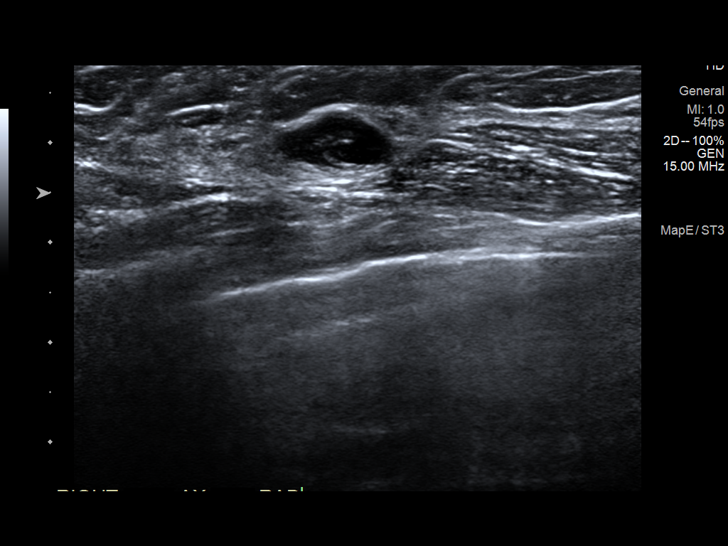
[im 8/11]
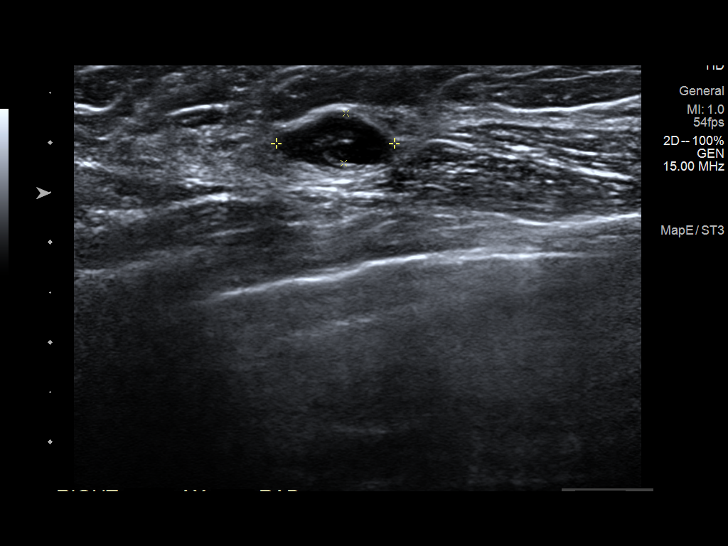
[im 9/11]
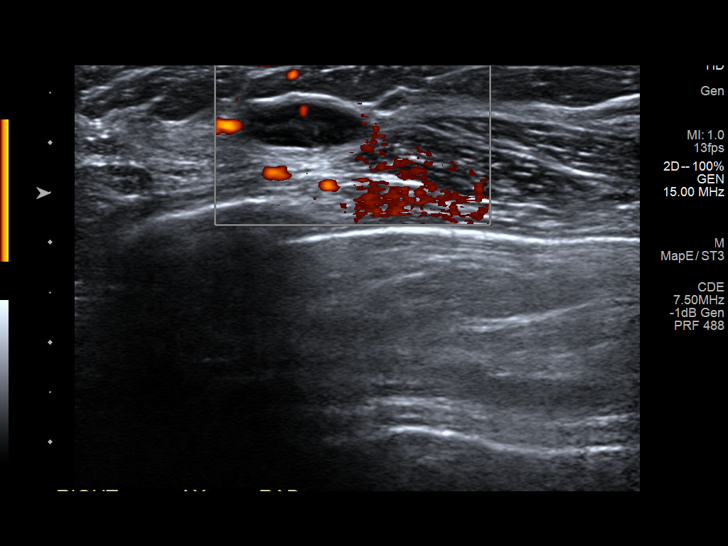
[im 10/11]
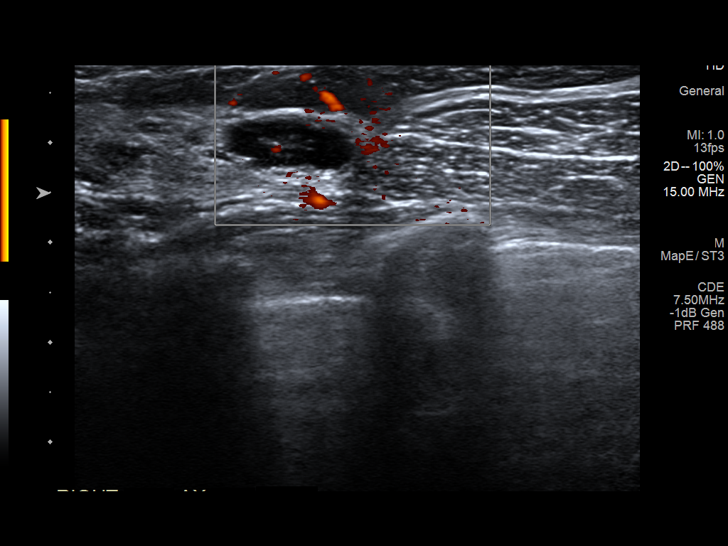
[im 11/11]
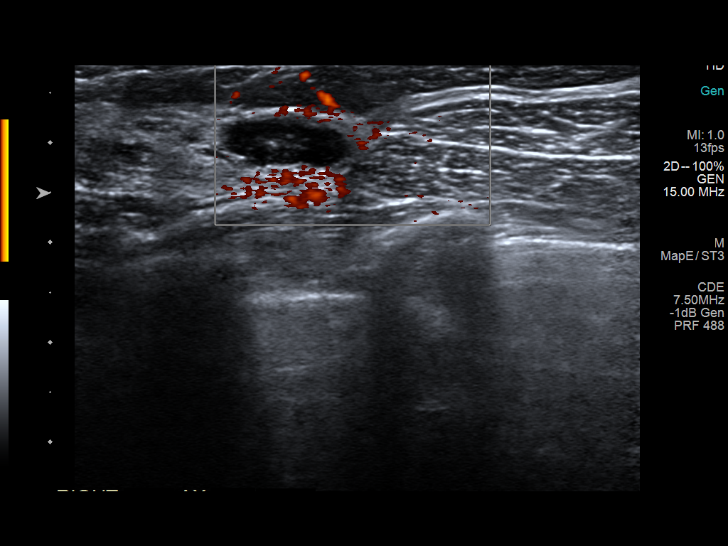

[11 of 11 positions shown; findings below may reference images not displayed]

ACR Breast Density Category d: The breast tissue is extremely dense,
which lowers the sensitivity of mammography.
FINDINGS: Metallic skin marker was placed in the region of the palpable mass
in the periareolar upper outer right breast. Deep to the metallic
skin marker is an area of architectural distortion associated with
an obscured mass. The mass/distortion is estimated to be 2 cm in
size on mammography, but the area is indistinct due to the extremely
dense breast parenchyma. There are no suspicious microcalcifications
in the right breast. No lymphadenopathy is visualized in the right
axilla.

On the left, no mass or architectural distortion is detected.
Negative for suspicious microcalcifications.

Mammographic images were processed with CAD.

On physical exam, there is a firm fixed mass measuring approximately
2 cm size in the retroareolar right breast 11 o'clock position. I do
not palpate any axillary lymphadenopathy.

Targeted ultrasound is performed, showing a hypoechoic irregular
mass at 11 o'clock position retroareolar measuring 2.1 x 1.5 x
cm. The deep margin of the mass is immediately adjacent to the
pectoralis muscle. A small area of vascular flow is seen within the
superficial aspect of the mass.

A markedly hypoechoic lymph node measuring 1.1 x 0.5 x 1.3 cm is
imaged in the axillary tail of the right breast at 10 o'clock
position. Ultrasound of the right axilla demonstrates a single small
lymph node with a very thin cortex and normal fatty hilum. No
suspicious lymph nodes are identified within the axilla.
IMPRESSION: 1. Palpable 2.1 cm mass in the 11 o'clock retroareolar right breast
is highly suspicious for malignancy.
2. 1.3 cm markedly hypoechoic lymph node in the 10 o'clock position
of the right breast axillary tail is indeterminate. Involvement with
metastatic carcinoma cannot be excluded.
3. No suspicious lymph nodes are identified within the right axilla.
4. Extremely dense breast parenchyma. If breast biopsy is positive
for malignancy, breast MRI is suggested.
5. No evidence of malignancy in the left breast.

RECOMMENDATION:
5: Highly suggestive of malignancy.

I have discussed the findings and recommendations with the patient.
Results were also provided in writing at the conclusion of the
visit. If applicable, a reminder letter will be sent to the patient
regarding the next appointment.

BI-RADS CATEGORY  5: Highly suggestive of malignancy.

## 2018-11-10 NOTE — Progress Notes (Signed)
Ensure pre surgery drink given with instructions to complete by 0415 dos, surgical soap given with instructions, pt verbalized understanding. 

## 2018-11-11 ENCOUNTER — Ambulatory Visit (HOSPITAL_BASED_OUTPATIENT_CLINIC_OR_DEPARTMENT_OTHER): Payer: BLUE CROSS/BLUE SHIELD | Admitting: Certified Registered"

## 2018-11-11 ENCOUNTER — Ambulatory Visit (HOSPITAL_BASED_OUTPATIENT_CLINIC_OR_DEPARTMENT_OTHER)
Admission: RE | Admit: 2018-11-11 | Discharge: 2018-11-11 | Disposition: A | Discharge status: Home / Self Care | Payer: BLUE CROSS/BLUE SHIELD | Source: Ambulatory Visit | Attending: Plastic Surgery | Admitting: Plastic Surgery

## 2018-11-11 ENCOUNTER — Encounter (HOSPITAL_BASED_OUTPATIENT_CLINIC_OR_DEPARTMENT_OTHER): Payer: Self-pay | Admitting: Certified Registered Nurse Anesthetist

## 2018-11-11 ENCOUNTER — Other Ambulatory Visit: Payer: Self-pay

## 2018-11-11 ENCOUNTER — Encounter (HOSPITAL_BASED_OUTPATIENT_CLINIC_OR_DEPARTMENT_OTHER)
Admission: RE | Disposition: A | Discharge status: Home / Self Care | Payer: Self-pay | Source: Ambulatory Visit | Attending: Plastic Surgery

## 2018-11-11 DIAGNOSIS — Z87891 Personal history of nicotine dependence: Secondary | ICD-10-CM | POA: Insufficient documentation

## 2018-11-11 DIAGNOSIS — Z9013 Acquired absence of bilateral breasts and nipples: Secondary | ICD-10-CM | POA: Insufficient documentation

## 2018-11-11 DIAGNOSIS — Z923 Personal history of irradiation: Secondary | ICD-10-CM | POA: Diagnosis not present

## 2018-11-11 DIAGNOSIS — Z421 Encounter for breast reconstruction following mastectomy: Secondary | ICD-10-CM | POA: Insufficient documentation

## 2018-11-11 DIAGNOSIS — Z853 Personal history of malignant neoplasm of breast: Secondary | ICD-10-CM | POA: Diagnosis not present

## 2018-11-11 HISTORY — DX: Other specified postprocedural states: R11.2

## 2018-11-11 HISTORY — DX: Dental restoration status: Z98.811

## 2018-11-11 HISTORY — DX: Other specified postprocedural states: Z98.890

## 2018-11-11 HISTORY — DX: Nausea with vomiting, unspecified: R11.2

## 2018-11-11 HISTORY — PX: LIPOSUCTION WITH LIPOFILLING: SHX6436

## 2018-11-11 HISTORY — DX: Personal history of irradiation: Z92.3

## 2018-11-11 HISTORY — DX: Personal history of malignant neoplasm of breast: Z85.3

## 2018-11-11 HISTORY — PX: REMOVAL OF BILATERAL TISSUE EXPANDERS WITH PLACEMENT OF BILATERAL BREAST IMPLANTS: SHX6431

## 2018-11-11 SURGERY — REMOVAL, TISSUE EXPANDER, BREAST, BILATERAL, WITH BILATERAL IMPLANT IMPLANT INSERTION
Anesthesia: General | Site: Thigh | Laterality: Bilateral

## 2018-11-11 MED ORDER — GABAPENTIN 300 MG PO CAPS
ORAL_CAPSULE | ORAL | Status: AC
  Filled 2018-11-11: qty 1

## 2018-11-11 MED ORDER — GABAPENTIN 300 MG PO CAPS
300.0000 mg | ORAL_CAPSULE | ORAL | Status: AC
  Administered 2018-11-11: 300 mg via ORAL

## 2018-11-11 MED ORDER — PROPOFOL 10 MG/ML IV BOLUS
INTRAVENOUS | Status: AC
  Filled 2018-11-11: qty 20

## 2018-11-11 MED ORDER — CELECOXIB 200 MG PO CAPS
ORAL_CAPSULE | ORAL | Status: AC
  Filled 2018-11-11: qty 1

## 2018-11-11 MED ORDER — CHLORHEXIDINE GLUCONATE CLOTH 2 % EX PADS: 6.0000 | MEDICATED_PAD | Freq: Once | CUTANEOUS | Status: DC

## 2018-11-11 MED ORDER — SCOPOLAMINE 1 MG/3DAYS TD PT72: 1.0000 | MEDICATED_PATCH | TRANSDERMAL | Status: DC

## 2018-11-11 MED ORDER — SCOPOLAMINE 1 MG/3DAYS TD PT72
1.0000 | MEDICATED_PATCH | Freq: Once | TRANSDERMAL | Status: DC | PRN
  Administered 2018-11-11: 1.5 mg via TRANSDERMAL

## 2018-11-11 MED ORDER — DEXAMETHASONE SODIUM PHOSPHATE 4 MG/ML IJ SOLN
INTRAMUSCULAR | Status: DC | PRN
  Administered 2018-11-11: 10 mg via INTRAVENOUS

## 2018-11-11 MED ORDER — PROMETHAZINE HCL 25 MG/ML IJ SOLN: 6.2500 mg | INTRAMUSCULAR | Status: DC | PRN

## 2018-11-11 MED ORDER — FENTANYL CITRATE (PF) 100 MCG/2ML IJ SOLN
INTRAMUSCULAR | Status: AC
  Filled 2018-11-11: qty 2

## 2018-11-11 MED ORDER — CELECOXIB 200 MG PO CAPS
200.0000 mg | ORAL_CAPSULE | ORAL | Status: AC
  Administered 2018-11-11: 200 mg via ORAL

## 2018-11-11 MED ORDER — ROCURONIUM BROMIDE 50 MG/5ML IV SOSY
PREFILLED_SYRINGE | INTRAVENOUS | Status: AC
  Filled 2018-11-11: qty 5

## 2018-11-11 MED ORDER — ONDANSETRON HCL 4 MG/2ML IJ SOLN
INTRAMUSCULAR | Status: AC
  Filled 2018-11-11: qty 2

## 2018-11-11 MED ORDER — ACETAMINOPHEN 500 MG PO TABS
ORAL_TABLET | ORAL | Status: AC
  Filled 2018-11-11: qty 2

## 2018-11-11 MED ORDER — ONDANSETRON HCL 4 MG/2ML IJ SOLN
INTRAMUSCULAR | Status: DC | PRN
  Administered 2018-11-11 (×2): 4 mg via INTRAVENOUS

## 2018-11-11 MED ORDER — HYDROMORPHONE HCL 1 MG/ML IJ SOLN
INTRAMUSCULAR | Status: AC
  Filled 2018-11-11: qty 0.5

## 2018-11-11 MED ORDER — SODIUM BICARBONATE 4 % IV SOLN
INTRAVENOUS | Status: DC | PRN
  Administered 2018-11-11: 200 mL via INTRAMUSCULAR

## 2018-11-11 MED ORDER — SODIUM BICARBONATE 4 % IV SOLN
INTRAVENOUS | Status: AC
  Filled 2018-11-11: qty 5

## 2018-11-11 MED ORDER — HYDROMORPHONE HCL 1 MG/ML IJ SOLN
0.2500 mg | INTRAMUSCULAR | Status: DC | PRN
  Administered 2018-11-11 (×2): 0.5 mg via INTRAVENOUS

## 2018-11-11 MED ORDER — PROPOFOL 10 MG/ML IV BOLUS
INTRAVENOUS | Status: DC | PRN
  Administered 2018-11-11: 150 mg via INTRAVENOUS
  Administered 2018-11-11: 50 mg via INTRAVENOUS

## 2018-11-11 MED ORDER — LIDOCAINE 2% (20 MG/ML) 5 ML SYRINGE
INTRAMUSCULAR | Status: AC
  Filled 2018-11-11: qty 5

## 2018-11-11 MED ORDER — FENTANYL CITRATE (PF) 100 MCG/2ML IJ SOLN: 50.0000 ug | INTRAMUSCULAR | Status: DC | PRN

## 2018-11-11 MED ORDER — KETOROLAC TROMETHAMINE 30 MG/ML IJ SOLN
INTRAMUSCULAR | Status: AC
  Filled 2018-11-11: qty 1

## 2018-11-11 MED ORDER — MIDAZOLAM HCL 2 MG/2ML IJ SOLN
INTRAMUSCULAR | Status: AC
  Filled 2018-11-11: qty 2

## 2018-11-11 MED ORDER — LIDOCAINE HCL (CARDIAC) PF 100 MG/5ML IV SOSY
PREFILLED_SYRINGE | INTRAVENOUS | Status: DC | PRN
  Administered 2018-11-11: 50 mg via INTRAVENOUS

## 2018-11-11 MED ORDER — MIDAZOLAM HCL 5 MG/5ML IJ SOLN
INTRAMUSCULAR | Status: DC | PRN
  Administered 2018-11-11 (×2): 1 mg via INTRAVENOUS

## 2018-11-11 MED ORDER — PROPOFOL 500 MG/50ML IV EMUL
INTRAVENOUS | Status: AC
  Filled 2018-11-11: qty 50

## 2018-11-11 MED ORDER — SUGAMMADEX SODIUM 200 MG/2ML IV SOLN
INTRAVENOUS | Status: DC | PRN
  Administered 2018-11-11: 200 mg via INTRAVENOUS

## 2018-11-11 MED ORDER — SUGAMMADEX SODIUM 200 MG/2ML IV SOLN
INTRAVENOUS | Status: AC
  Filled 2018-11-11: qty 2

## 2018-11-11 MED ORDER — EPINEPHRINE 30 MG/30ML IJ SOLN
INTRAMUSCULAR | Status: AC
  Filled 2018-11-11: qty 1

## 2018-11-11 MED ORDER — CEFAZOLIN SODIUM-DEXTROSE 2-4 GM/100ML-% IV SOLN
INTRAVENOUS | Status: AC
  Filled 2018-11-11: qty 100

## 2018-11-11 MED ORDER — SODIUM CHLORIDE 0.9 % IV SOLN
INTRAVENOUS | Status: DC | PRN
  Administered 2018-11-11: 1000 mL

## 2018-11-11 MED ORDER — LACTATED RINGERS IV SOLN
INTRAVENOUS | Status: DC
  Administered 2018-11-11 (×2): via INTRAVENOUS

## 2018-11-11 MED ORDER — ROCURONIUM BROMIDE 100 MG/10ML IV SOLN
INTRAVENOUS | Status: DC | PRN
  Administered 2018-11-11: 50 mg via INTRAVENOUS
  Administered 2018-11-11: 20 mg via INTRAVENOUS

## 2018-11-11 MED ORDER — MIDAZOLAM HCL 2 MG/2ML IJ SOLN: 1.0000 mg | INTRAMUSCULAR | Status: DC | PRN

## 2018-11-11 MED ORDER — PROPOFOL 500 MG/50ML IV EMUL
INTRAVENOUS | Status: DC | PRN
  Administered 2018-11-11: 150 ug/kg/min via INTRAVENOUS

## 2018-11-11 MED ORDER — ACETAMINOPHEN 500 MG PO TABS
1000.0000 mg | ORAL_TABLET | ORAL | Status: AC
  Administered 2018-11-11: 1000 mg via ORAL

## 2018-11-11 MED ORDER — SCOPOLAMINE 1 MG/3DAYS TD PT72
MEDICATED_PATCH | TRANSDERMAL | Status: AC
  Filled 2018-11-11: qty 1

## 2018-11-11 MED ORDER — CEFAZOLIN SODIUM-DEXTROSE 2-4 GM/100ML-% IV SOLN
2.0000 g | INTRAVENOUS | Status: AC
  Administered 2018-11-11: 2 g via INTRAVENOUS

## 2018-11-11 MED ORDER — FENTANYL CITRATE (PF) 100 MCG/2ML IJ SOLN
INTRAMUSCULAR | Status: DC | PRN
  Administered 2018-11-11: 50 ug via INTRAVENOUS
  Administered 2018-11-11: 100 ug via INTRAVENOUS
  Administered 2018-11-11: 50 ug via INTRAVENOUS

## 2018-11-11 MED ORDER — DEXAMETHASONE SODIUM PHOSPHATE 10 MG/ML IJ SOLN
INTRAMUSCULAR | Status: AC
  Filled 2018-11-11: qty 1

## 2018-11-11 MED ORDER — LIDOCAINE HCL (PF) 1 % IJ SOLN
INTRAMUSCULAR | Status: AC
  Filled 2018-11-11: qty 30

## 2018-11-11 SURGICAL SUPPLY — 92 items
ADH SKN CLS APL DERMABOND .7 (GAUZE/BANDAGES/DRESSINGS) ×4
BAG DECANTER FOR FLEXI CONT (MISCELLANEOUS) ×3 IMPLANT
BINDER ABDOMINAL 10 UNV 27-48 (MISCELLANEOUS) IMPLANT
BINDER ABDOMINAL 12 SM 30-45 (SOFTGOODS) IMPLANT
BINDER BREAST 3XL (GAUZE/BANDAGES/DRESSINGS) IMPLANT
BINDER BREAST LRG (GAUZE/BANDAGES/DRESSINGS) IMPLANT
BINDER BREAST MEDIUM (GAUZE/BANDAGES/DRESSINGS) ×1 IMPLANT
BINDER BREAST XLRG (GAUZE/BANDAGES/DRESSINGS) IMPLANT
BINDER BREAST XXLRG (GAUZE/BANDAGES/DRESSINGS) IMPLANT
BLADE SURG 10 STRL SS (BLADE) ×6 IMPLANT
BLADE SURG 11 STRL SS (BLADE) ×3 IMPLANT
BNDG GAUZE ELAST 4 BULKY (GAUZE/BANDAGES/DRESSINGS) ×6 IMPLANT
CANISTER LIPO FAT HARVEST (MISCELLANEOUS) ×3 IMPLANT
CANISTER SUCT 1200ML W/VALVE (MISCELLANEOUS) ×4 IMPLANT
CHLORAPREP W/TINT 26ML (MISCELLANEOUS) ×6 IMPLANT
COVER BACK TABLE 60X90IN (DRAPES) ×3 IMPLANT
COVER MAYO STAND STRL (DRAPES) ×3 IMPLANT
COVER WAND RF STERILE (DRAPES) IMPLANT
DECANTER SPIKE VIAL GLASS SM (MISCELLANEOUS) IMPLANT
DERMABOND ADVANCED (GAUZE/BANDAGES/DRESSINGS) ×2
DERMABOND ADVANCED .7 DNX12 (GAUZE/BANDAGES/DRESSINGS) ×4 IMPLANT
DRAIN CHANNEL 15F RND FF W/TCR (WOUND CARE) IMPLANT
DRAPE TOP ARMCOVERS (MISCELLANEOUS) ×3 IMPLANT
DRAPE U-SHAPE 76X120 STRL (DRAPES) ×3 IMPLANT
DRSG PAD ABDOMINAL 8X10 ST (GAUZE/BANDAGES/DRESSINGS) ×6 IMPLANT
ELECT BLADE 4.0 EZ CLEAN MEGAD (MISCELLANEOUS) ×3
ELECT COATED BLADE 2.86 ST (ELECTRODE) ×3 IMPLANT
ELECT REM PT RETURN 9FT ADLT (ELECTROSURGICAL) ×3
ELECTRODE BLDE 4.0 EZ CLN MEGD (MISCELLANEOUS) ×2 IMPLANT
ELECTRODE REM PT RTRN 9FT ADLT (ELECTROSURGICAL) ×2 IMPLANT
EVACUATOR SILICONE 100CC (DRAIN) IMPLANT
GLOVE BIO SURGEON STRL SZ 6 (GLOVE) ×8 IMPLANT
GLOVE BIO SURGEON STRL SZ7 (GLOVE) ×2 IMPLANT
GLOVE BIOGEL PI IND STRL 7.0 (GLOVE) IMPLANT
GLOVE BIOGEL PI IND STRL 7.5 (GLOVE) IMPLANT
GLOVE BIOGEL PI INDICATOR 7.0 (GLOVE) ×1
GLOVE BIOGEL PI INDICATOR 7.5 (GLOVE) ×2
GLOVE SURG SYN 7.5  E (GLOVE) ×3
GLOVE SURG SYN 7.5 E (GLOVE) ×6 IMPLANT
GLOVE SURG SYN 7.5 PF PI (GLOVE) IMPLANT
GOWN STRL REUS W/ TWL LRG LVL3 (GOWN DISPOSABLE) ×4 IMPLANT
GOWN STRL REUS W/ TWL XL LVL3 (GOWN DISPOSABLE) IMPLANT
GOWN STRL REUS W/TWL LRG LVL3 (GOWN DISPOSABLE) ×6
GOWN STRL REUS W/TWL XL LVL3 (GOWN DISPOSABLE) ×3
IMPL BREAST GEL MP 345CC (Breast) IMPLANT
IMPLANT BREAST GEL MP 345CC (Breast) ×6 IMPLANT
IV NS 500ML (IV SOLUTION)
IV NS 500ML BAXH (IV SOLUTION) IMPLANT
KIT FILL SYSTEM UNIVERSAL (SET/KITS/TRAYS/PACK) IMPLANT
LINER CANISTER 1000CC FLEX (MISCELLANEOUS) ×3 IMPLANT
MARKER SKIN DUAL TIP RULER LAB (MISCELLANEOUS) IMPLANT
NDL FILTER BLUNT 18X1 1/2 (NEEDLE) IMPLANT
NDL HYPO 25X1 1.5 SAFETY (NEEDLE) IMPLANT
NDL SAFETY ECLIPSE 18X1.5 (NEEDLE) ×2 IMPLANT
NEEDLE FILTER BLUNT 18X 1/2SAF (NEEDLE) ×1
NEEDLE FILTER BLUNT 18X1 1/2 (NEEDLE) ×2 IMPLANT
NEEDLE HYPO 18GX1.5 SHARP (NEEDLE) ×3
NEEDLE HYPO 25X1 1.5 SAFETY (NEEDLE) IMPLANT
NS IRRIG 1000ML POUR BTL (IV SOLUTION) IMPLANT
PACK BASIN DAY SURGERY FS (CUSTOM PROCEDURE TRAY) ×3 IMPLANT
PAD ALCOHOL SWAB (MISCELLANEOUS) ×3 IMPLANT
PENCIL BUTTON HOLSTER BLD 10FT (ELECTRODE) ×3 IMPLANT
PIN SAFETY STERILE (MISCELLANEOUS) IMPLANT
PUNCH BIOPSY DERMAL 4MM (MISCELLANEOUS) IMPLANT
SHEET MEDIUM DRAPE 40X70 STRL (DRAPES) ×7 IMPLANT
SIZER BREAST 345CC (SIZER) ×3
SIZER BREAST INSPI REUSE 375CC (SIZER) ×3
SIZER BRST INSPI REUSE 375CC (SIZER) IMPLANT
SIZER BRST P4.2XMDRT 345CC (SIZER) IMPLANT
SLEEVE SCD COMPRESS KNEE MED (MISCELLANEOUS) ×3 IMPLANT
SPONGE LAP 18X18 RF (DISPOSABLE) ×7 IMPLANT
STAPLER VISISTAT 35W (STAPLE) ×3 IMPLANT
SUT ETHILON 2 0 FS 18 (SUTURE) IMPLANT
SUT MNCRL AB 4-0 PS2 18 (SUTURE) ×6 IMPLANT
SUT PDS AB 2-0 CT2 27 (SUTURE) IMPLANT
SUT VIC AB 3-0 PS1 18 (SUTURE)
SUT VIC AB 3-0 PS1 18XBRD (SUTURE) IMPLANT
SUT VIC AB 3-0 SH 27 (SUTURE) ×6
SUT VIC AB 3-0 SH 27X BRD (SUTURE) ×4 IMPLANT
SUT VICRYL 4-0 PS2 18IN ABS (SUTURE) ×6 IMPLANT
SYR 10ML LL (SYRINGE) ×12 IMPLANT
SYR 20CC LL (SYRINGE) ×1 IMPLANT
SYR 50ML LL SCALE MARK (SYRINGE) ×9 IMPLANT
SYR BULB IRRIGATION 50ML (SYRINGE) ×6 IMPLANT
SYR CONTROL 10ML LL (SYRINGE) IMPLANT
SYR TB 1ML LL NO SAFETY (SYRINGE) ×3 IMPLANT
TOWEL GREEN STERILE FF (TOWEL DISPOSABLE) ×6 IMPLANT
TUBE CONNECTING 20X1/4 (TUBING) ×6 IMPLANT
TUBING INFILTRATION IT-10001 (TUBING) ×3 IMPLANT
TUBING SET GRADUATE ASPIR 12FT (MISCELLANEOUS) ×3 IMPLANT
UNDERPAD 30X30 (UNDERPADS AND DIAPERS) ×6 IMPLANT
YANKAUER SUCT BULB TIP NO VENT (SUCTIONS) ×3 IMPLANT

## 2018-11-11 NOTE — Anesthesia Procedure Notes (Signed)
Procedure Name: Intubation Date/Time: 11/11/2018 7:37 AM Performed by: Bufford Spikes, CRNA Pre-anesthesia Checklist: Patient identified, Emergency Drugs available, Suction available and Patient being monitored Patient Re-evaluated:Patient Re-evaluated prior to induction Oxygen Delivery Method: Circle system utilized Preoxygenation: Pre-oxygenation with 100% oxygen Induction Type: IV induction Ventilation: Mask ventilation without difficulty Laryngoscope Size: Miller and 2 Grade View: Grade II Tube type: Oral Tube size: 7.0 mm Number of attempts: 1 Airway Equipment and Method: Stylet Placement Confirmation: ETT inserted through vocal cords under direct vision,  positive ETCO2 and breath sounds checked- equal and bilateral Secured at: 21 cm Tube secured with: Tape Dental Injury: Teeth and Oropharynx as per pre-operative assessment

## 2018-11-11 NOTE — Anesthesia Postprocedure Evaluation (Signed)
Anesthesia Post Note  Patient: Dawn Ramos  Procedure(s) Performed: REMOVAL OF BILATERAL TISSUE EXPANDERS WITH PLACEMENT OF BILATERAL SILICONE BREAST IMPLANTS (Bilateral Breast) LIPOFILLING FROM THIGHS TO BILATERAL CHEST (Bilateral Thigh)     Patient location during evaluation: PACU Anesthesia Type: General Level of consciousness: sedated Pain management: pain level controlled Vital Signs Assessment: post-procedure vital signs reviewed and stable Respiratory status: spontaneous breathing and respiratory function stable Cardiovascular status: stable Postop Assessment: no apparent nausea or vomiting Anesthetic complications: no    Last Vitals:  Vitals:   11/11/18 1045 11/11/18 1100  BP: 106/66 100/66  Pulse: (!) 47 70  Resp: 12 16  Temp:  36.6 C  SpO2: 97% 95%    Last Pain:  Vitals:   11/11/18 1100  TempSrc:   PainSc: 4                  Jori Frerichs DANIEL

## 2018-11-11 NOTE — Op Note (Signed)
Operative Note   DATE OF OPERATION: 11.22.2019  LOCATION: [facility]-outpatient  SURGICAL DIVISION: Plastic Surgery  PREOPERATIVE DIAGNOSES:  1. History breast cancer 2. Acquired absence breasts 3. History therapeutic radiation  POSTOPERATIVE DIAGNOSES:  same  PROCEDURE:  1. Removal bilateral tissue expanders and placement silicone implants 2. Lipofilling bilateral chest   SURGEON: Irene Limbo MD MBA  ASSISTANT: none  ANESTHESIA:  General.   EBL: 25 ml  COMPLICATIONS: None immediate.   INDICATIONS FOR PROCEDURE:  The patient, Dawn Ramos, is a 42 y.o. female born on 05-01-1976, is here for staged breast reconstruction following bilateral nipple sparing mastectomies with immediate prepectoral expander acellular dermis reconstruction. She has received right chest radiation.   FINDINGS: Full incorporation ADM noted bilateral. "Smile" mastopexy completed over left chest. 40 ml fat infiltrated over right chest and 25 ml over left chest. Natrelle Inspira Smooth Round Moderate Projection 345 ml implants placed bilateral. REF SRM-345 RIGHT SN [id number] LEFT SN [id number]  DESCRIPTION OF PROCEDURE:  The patient's operative site was marked with the patient in the preoperative area to mark sternal notch, chest midline, anterior axillary lines.Bilateral medial thighs marked for liposuction.The patientwas taken to the operating room. SCDs were placed and IV antibiotics were given. The patient's operative site was prepped and draped in a sterile fashion. A time out was performed and all information was confirmed to be correct.I began onleftside. Following radiation, the right IMF and soft tissue envelope has contracted with some asymmetry lower pole. "Smile" mastopexy designed on left. Skin marked cephalic to IMF scar was deepithelialized. Incision carried carried through superficial fascia toacellular demis.ADM incised.Expander removed. Capsulotomies performedsuperiorly.Sizer  placed.  I then directed attention torightbreast. Incision made in inframammary fold scar and carried through superficial fascia and ADM. Expander removed and well incorporated ADM noted.Capsulotomies performed circumferentially. Sizer placed and patient brought to upright sitting position. Natrelle Smooth RoundModerate Projection368mlimplants selected for bilateral placement.The patient was returned to supine position.   Stab incision made over bilateralmedial thigh. Tumescent fluid infiltrated over lateral thighs,total265ml tumescent infiltrated. Power assisted liposuction performed to endpoint symmetric contour and soft tissue thickness, total lipoaspirate241ml. The fat was then washed and prepared by gravity for infiltration. Harvested fat was then infiltrated in subcutaneous plane throughout total envelope mastectomy flaps.  Each cavity irrigated with bacitracin, Ancef, gentamicinsolution.Hemostasis ensured.Each cavity then irrigated with Betadine. The implant was placed inrightchest andimplant orientation ensured. Closure completed with 3-0 vicryl to closesuperficial fascia and ADMover implant.4-0 vicryl used to close dermis followed by 4-0 monocryl subcuticular. Implant placed inleftchest cavity.Closure completedin similar fashion.Thigh incisions approximated with simple 4-0 monocryl stitch.Tissue adhesive and dry dressing applied, followed by breastbinderand compression garment.  The patient was allowed to wake from anesthesia, extubated and taken to the recovery room in satisfactory condition.   SPECIMENS: none  DRAINS: none  Irene Limbo, MD [facility] Plastic & Reconstructive Surgery [phone], pin [phone]

## 2018-11-11 NOTE — Transfer of Care (Signed)
Immediate Anesthesia Transfer of Care Note  Patient: Dawn Ramos  Procedure(s) Performed: REMOVAL OF BILATERAL TISSUE EXPANDERS WITH PLACEMENT OF BILATERAL SILICONE BREAST IMPLANTS (Bilateral Breast) LIPOFILLING FROM THIGHS TO BILATERAL CHEST (Bilateral Thigh)  Patient Location: PACU  Anesthesia Type:General  Level of Consciousness: awake, alert  and oriented  Airway & Oxygen Therapy: Patient Spontanous Breathing and Patient connected to nasal cannula oxygen  Post-op Assessment: Report given to RN and Post -op Vital signs reviewed and stable  Post vital signs: Reviewed and stable  Last Vitals:  Vitals Value Taken Time  BP 121/98 11/11/2018  9:52 AM  Temp    Pulse 72 11/11/2018  9:54 AM  Resp 13 11/11/2018  9:54 AM  SpO2 100 % 11/11/2018  9:54 AM  Vitals shown include unvalidated device data.  Last Pain:  Vitals:   11/11/18 0702  TempSrc: Oral  PainSc: 0-No pain         Complications: No apparent anesthesia complications

## 2018-11-11 NOTE — Anesthesia Preprocedure Evaluation (Addendum)
Anesthesia Evaluation  Patient identified by MRN, date of birth, ID band Patient awake    Reviewed: Allergy & Precautions, H&P , Patient's Chart, lab work & pertinent test results, reviewed documented beta blocker date and time   History of Anesthesia Complications (+) PONV and history of anesthetic complications  Airway Mallampati: II  TM Distance: >3 FB Neck ROM: full    Dental no notable dental hx. (+) Dental Advisory Given,    Pulmonary former smoker,    Pulmonary exam normal breath sounds clear to auscultation       Cardiovascular negative cardio ROS   Rhythm:regular Rate:Normal     Neuro/Psych PSYCHIATRIC DISORDERS Depression Bipolar Disorder negative neurological ROS     GI/Hepatic negative GI ROS, Neg liver ROS,   Endo/Other  negative endocrine ROS  Renal/GU negative Renal ROS     Musculoskeletal   Abdominal   Peds  Hematology negative hematology ROS (+)   Anesthesia Other Findings   Reproductive/Obstetrics                             Anesthesia Physical  Anesthesia Plan  ASA: II  Anesthesia Plan: General   Post-op Pain Management:    Induction: Intravenous  PONV Risk Score and Plan: 4 or greater and Ondansetron, Dexamethasone, Treatment may vary due to age or medical condition, Scopolamine patch - Pre-op and TIVA  Airway Management Planned: Oral ETT and LMA  Additional Equipment:   Intra-op Plan:   Post-operative Plan: Extubation in OR  Informed Consent: I have reviewed the patients History and Physical, chart, labs and discussed the procedure including the risks, benefits and alternatives for the proposed anesthesia with the patient or authorized representative who has indicated his/her understanding and acceptance.   Dental Advisory Given  Plan Discussed with: CRNA and Surgeon  Anesthesia Plan Comments: (  )       Anesthesia Quick Evaluation

## 2018-11-11 NOTE — Discharge Instructions (Signed)
° °  NO TYLENOL BEFORE 1 PM TODAY ! ° ° ° ° ° °Post Anesthesia Home Care Instructions ° °Activity: °Get plenty of rest for the remainder of the day. A responsible individual must stay with you for 24 hours following the procedure.  °For the next 24 hours, DO NOT: °-Drive a car °-Operate machinery °-Drink alcoholic beverages °-Take any medication unless instructed by your physician °-Make any legal decisions or sign important papers. ° °Meals: °Start with liquid foods such as gelatin or soup. Progress to regular foods as tolerated. Avoid greasy, spicy, heavy foods. If nausea and/or vomiting occur, drink only clear liquids until the nausea and/or vomiting subsides. Call your physician if vomiting continues. ° °Special Instructions/Symptoms: °Your throat may feel dry or sore from the anesthesia or the breathing tube placed in your throat during surgery. If this causes discomfort, gargle with warm salt water. The discomfort should disappear within 24 hours. ° °If you had a scopolamine patch placed behind your ear for the management of post- operative nausea and/or vomiting: ° °1. The medication in the patch is effective for 72 hours, after which it should be removed.  Wrap patch in a tissue and discard in the trash. Wash hands thoroughly with soap and water. °2. You may remove the patch earlier than 72 hours if you experience unpleasant side effects which may include dry mouth, dizziness or visual disturbances. °3. Avoid touching the patch. Wash your hands with soap and water after contact with the patch. °   ° ° °

## 2018-11-11 NOTE — Interval H&P Note (Signed)
History and Physical Interval Note:  11/11/2018 6:54 AM  Dawn Ramos  has presented today for surgery, with the diagnosis of history breast cancer, acquired absence breasts, history therapeutic radiation  The various methods of treatment have been discussed with the patient and family. After consideration of risks, benefits and other options for treatment, the patient has consented to  Removal bilateral tissue expanders placement silicone implants, lipofilling from bilateral thighs to chest as a surgical intervention .  The patient's history has been reviewed, patient examined, no change in status, stable for surgery.  I have reviewed the patient's chart and labs.  Questions were answered to the patient's satisfaction.     Arnoldo Hooker Waldron Gerry

## 2018-11-13 IMAGING — MG MM CLIP PLACEMENT
2 series · 2 of 2 positions shown · non-contrast
Comparison: Previous exam(s).

CLINICAL DATA: Evaluate biopsy markers

EXAM:
DIAGNOSTIC RIGHT MAMMOGRAM POST ULTRASOUND BIOPSY

[R CC]
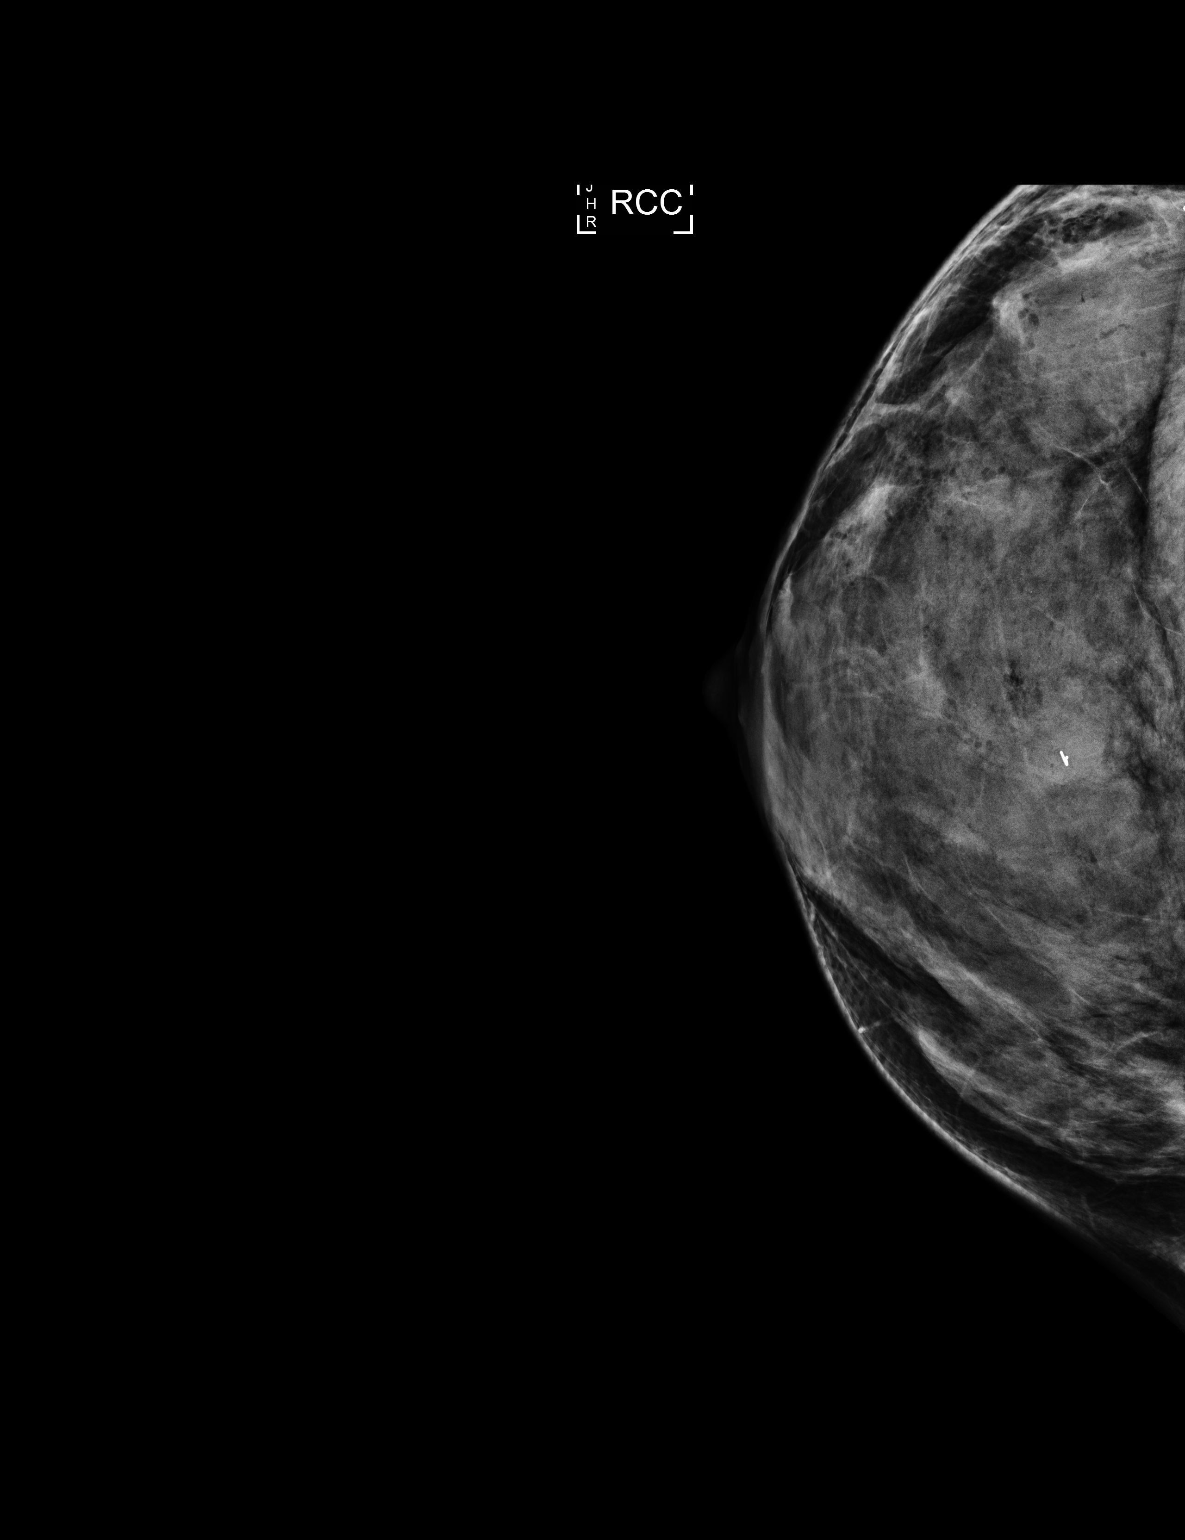

[R ML]
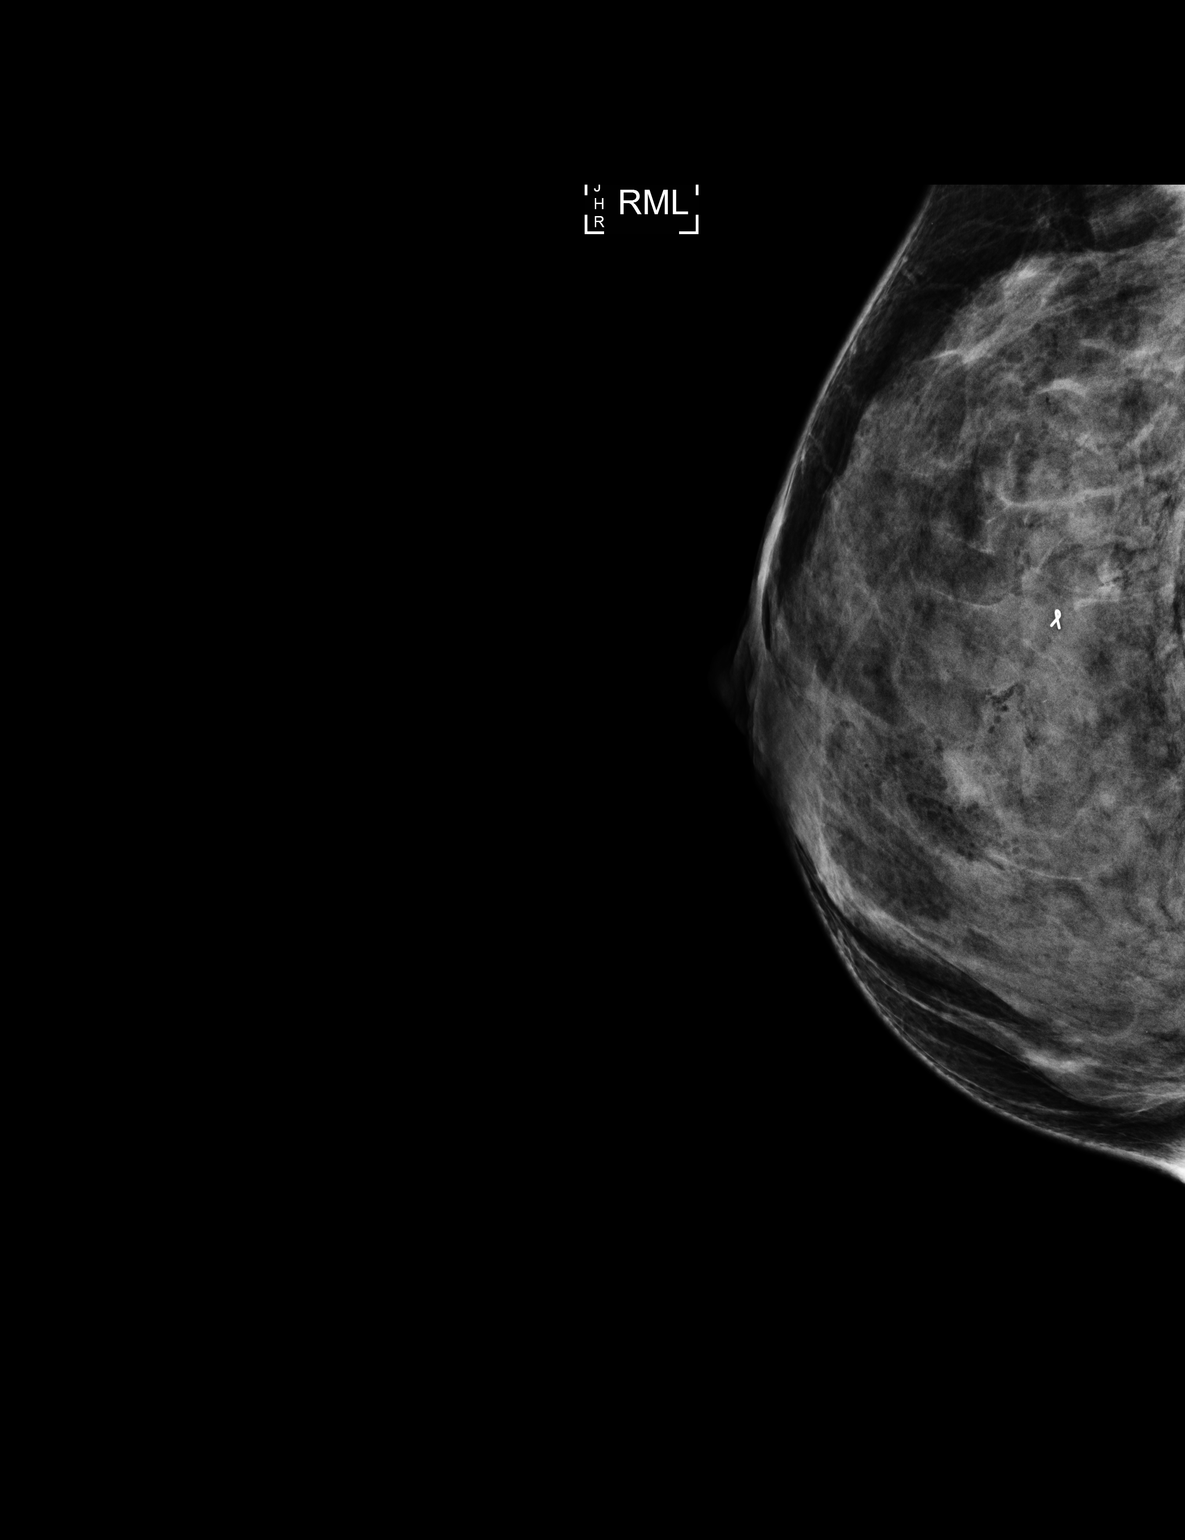

[2 of 2 positions shown; findings below may reference images not displayed]

FINDINGS: Mammographic images were obtained following ultrasound guided biopsy
of a right axillary tail lymph node and a right breast mass. The
ribbon shaped biopsy clip is in the region of the biopsied mass. The
clip placed into the lymph node was not visualized, likely too deep
within the breast to pull into view, on this study.
IMPRESSION: Clip placement as above.

Final Assessment: Post Procedure Mammograms for Marker Placement

## 2018-11-13 IMAGING — US US BREAST BX W LOC DEV 1ST LESION IMG BX SPEC US GUIDE*R*
1 series · 13 of 19 positions shown · non-contrast
Comparison: Previous exam(s).

ADDENDUM:
Pathology revealed GRADE II INVASIVE AND IN SITU MAMMARY CARCINOMA
of the Right breast, 11:00 o'clock. FIBROCYSTIC CHANGES of the Right
breast, 10:00 o'clock lymph node. This was found to be concordant by
Dr. Anabila Jonny. Pathology results were discussed with the
patient by telephone. The patient reported doing well after the
biopsies with tenderness at the sites. Post biopsy instructions and
care were reviewed and questions were answered. The patient was
encouraged to call The [REDACTED] for any
additional concerns. The patient was referred to [REDACTED] [REDACTED] at [REDACTED] [REDACTED] on August 18, 2017. Recommendation for bilateral breast MRI
due to extremely dense breast tissue.

Pathology results reported by Queen Latifa Suwa, RN on 08/12/2017.
CLINICAL DATA: Right breast mass biopsy
EXAM:
ULTRASOUND GUIDED RIGHT BREAST CORE NEEDLE BIOPSY

[Series 1: us breast bx w loc dev 1st lesion img bx spec us g · 0.07mm/px · 13 of 19 slices shown]
[im 1/19]
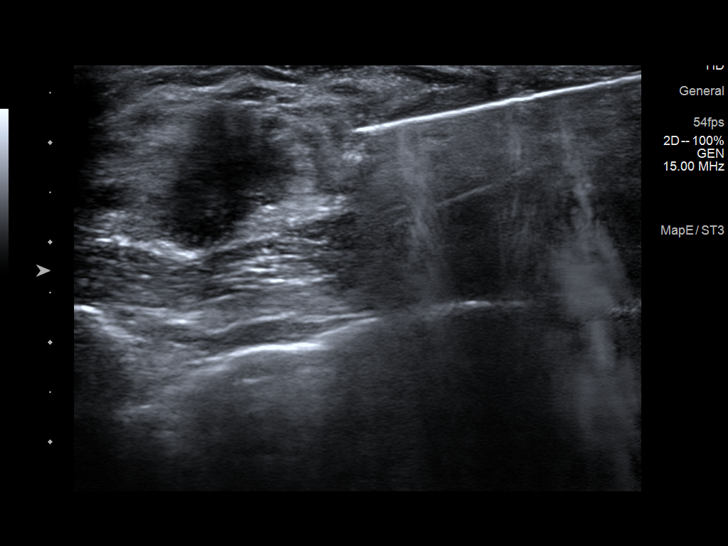
[im 3/19]
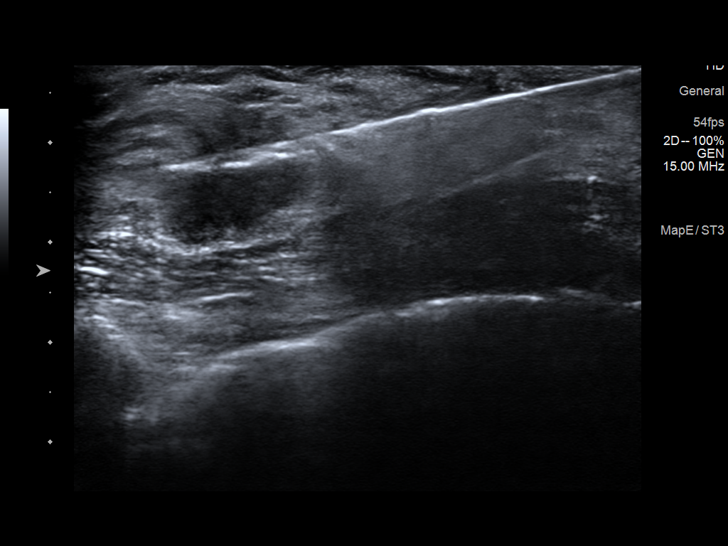
[im 4/19]
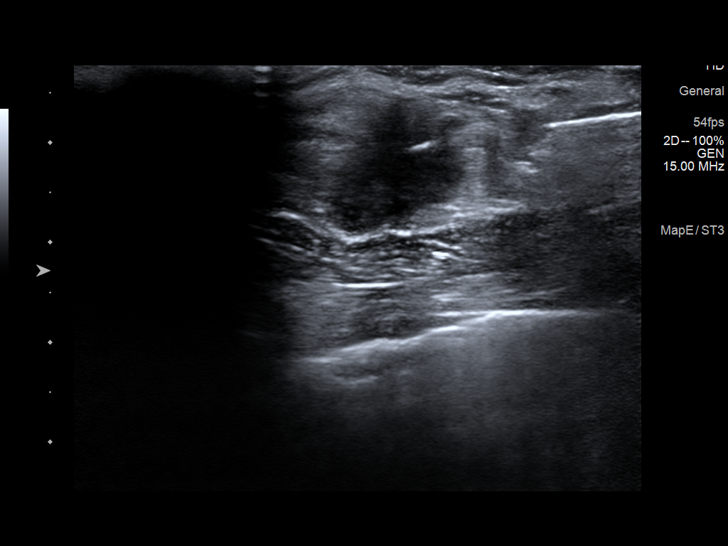
[im 6/19]
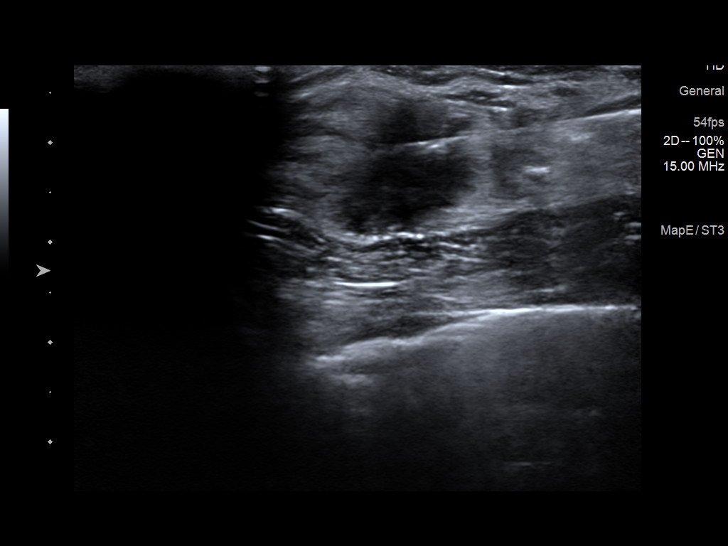
[im 7/19]
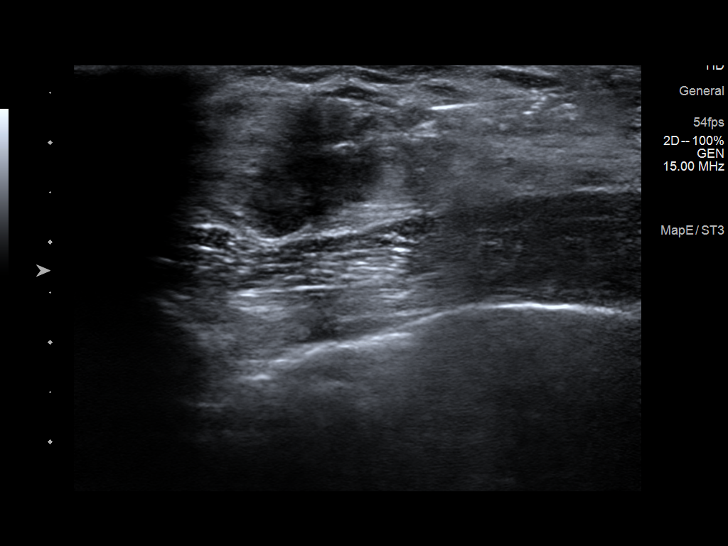
[im 9/19]
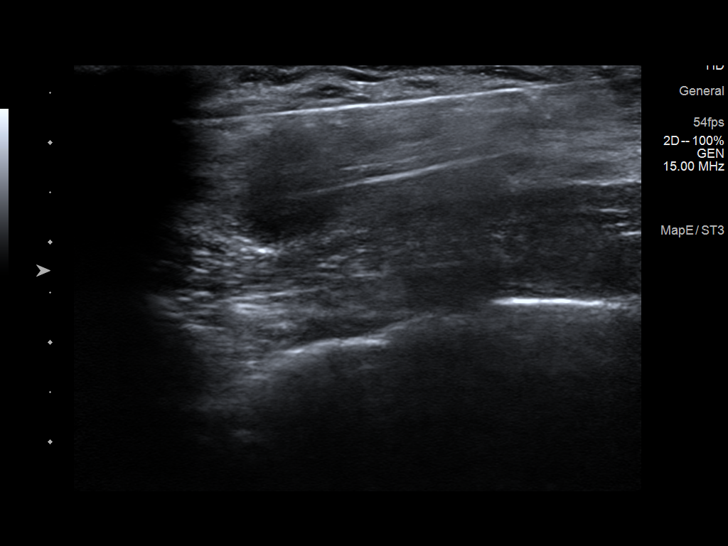
[im 10/19]
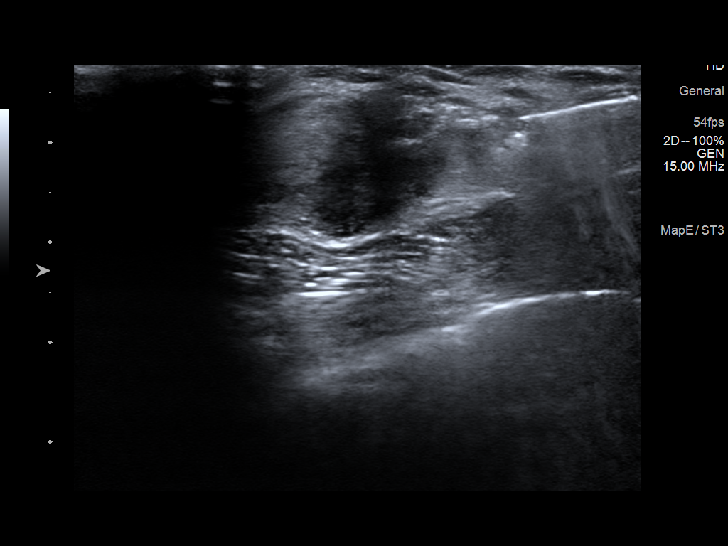
[im 11/19]
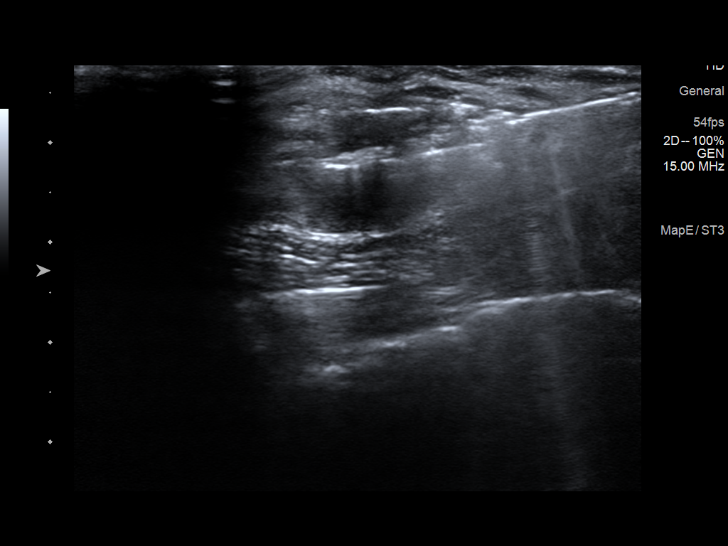
[im 13/19]
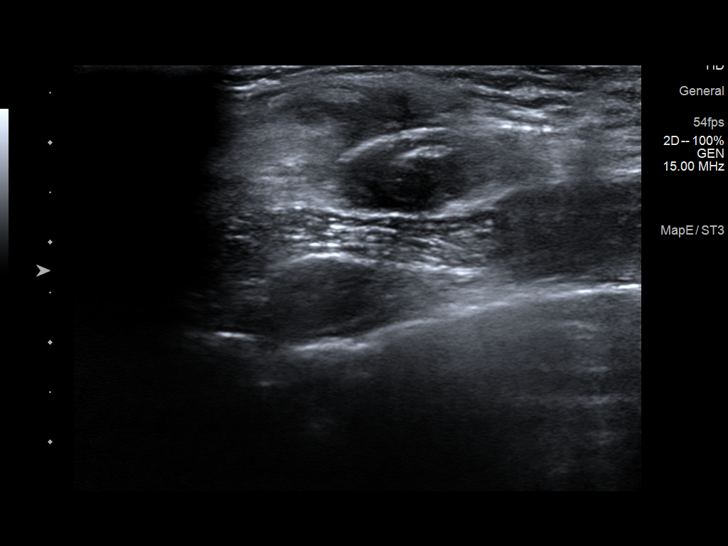
[im 14/19]
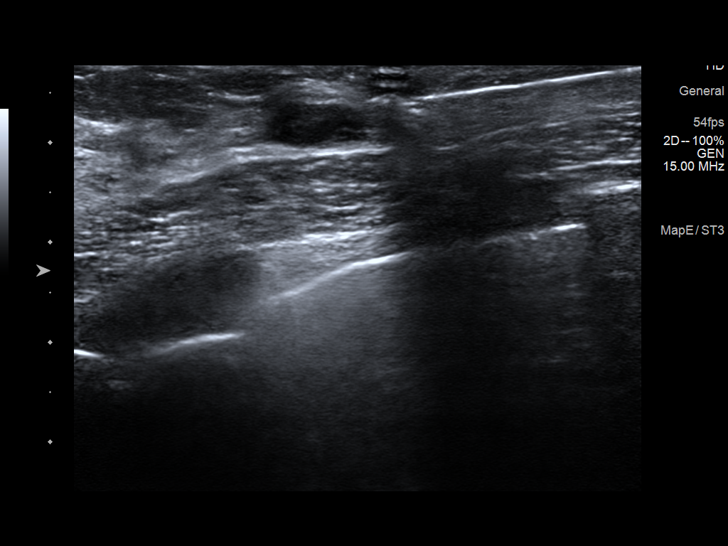
[im 16/19]
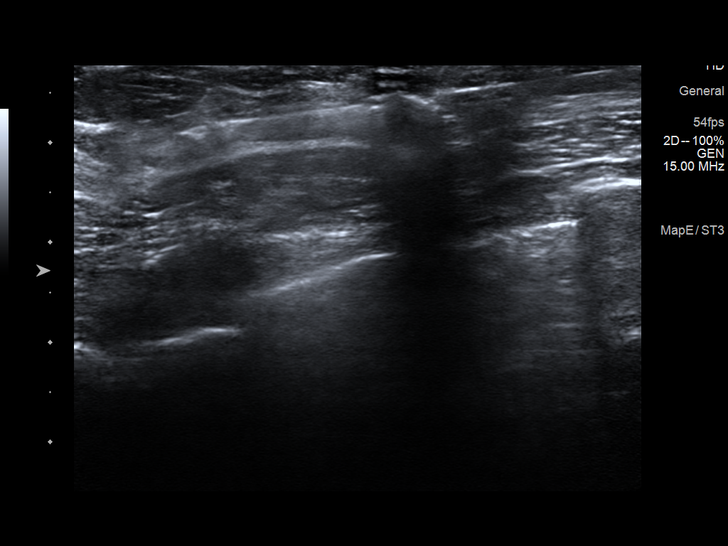
[im 17/19]
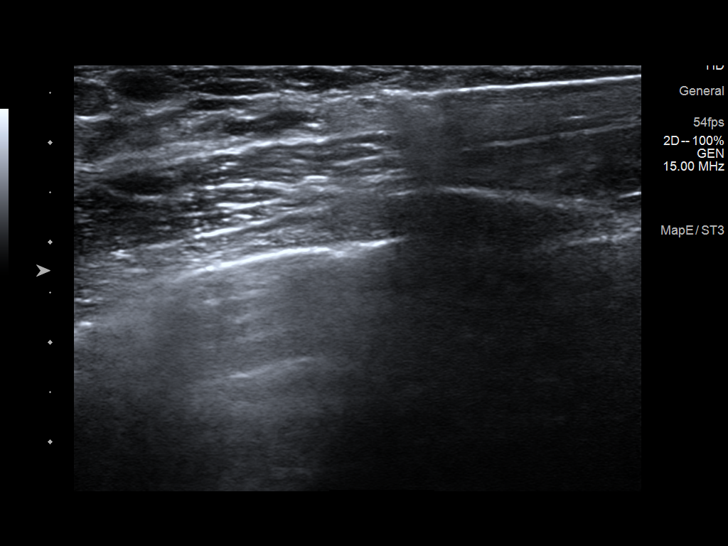
[im 19/19]
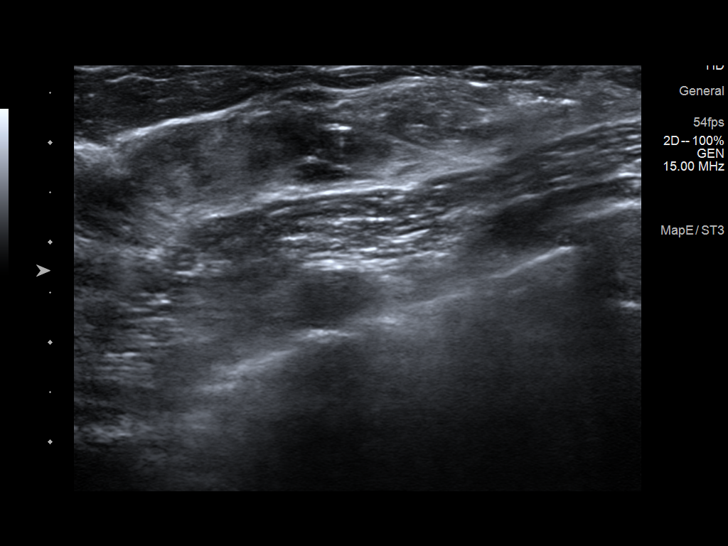

[13 of 19 positions shown; findings below may reference images not displayed]



Lesion quadrant: Upper-outer

Using sterile technique and 1% Lidocaine as local anesthetic, under
direct ultrasound visualization, a 12 gauge Hong Yul device was
used to perform biopsy of a right breast mass using a lateral
approach. At the conclusion of the procedure a tissue marker clip
was deployed into the biopsy cavity. Follow up 2 view mammogram was
performed and dictated separately.
IMPRESSION: Ultrasound guided biopsy of a right breast mass. No apparent
complications.

## 2018-11-14 ENCOUNTER — Encounter (HOSPITAL_BASED_OUTPATIENT_CLINIC_OR_DEPARTMENT_OTHER): Payer: Self-pay | Admitting: Plastic Surgery

## 2018-11-18 ENCOUNTER — Telehealth: Payer: Self-pay

## 2018-11-18 ENCOUNTER — Telehealth: Payer: Self-pay | Admitting: Hematology

## 2018-11-18 NOTE — Telephone Encounter (Signed)
Called regarding 12/2 °

## 2018-11-18 NOTE — Telephone Encounter (Signed)
Patient called to schedule an appointment with Dr. Burr Medico in December, her insurance is changing.  Scheduling message was sent.

## 2018-11-20 NOTE — Progress Notes (Signed)
[place]  Telephone:([phone]) [phone] [phone]) [phone]  Clinic Follow up Note   Patient Care Team: [facility] [place] as PCP - General (Family Medicine) Truitt Merle, MD as Consulting Physician (Hematology) Stark Klein, MD as Consulting Physician (General Surgery) Kyung Rudd, MD as Consulting Physician (Radiation Oncology) 11/21/2018  SUMMARY OF ONCOLOGIC HISTORY: Oncology History   Cancer Staging Malignant neoplasm of upper-outer quadrant of right breast in female, estrogen receptor positive ([place]) Staging form: Breast, AJCC 8th Edition - Clinical stage from 08/11/2017: Stage IB (cT2, cN0, cM0, G2, ER: Positive, PR: Positive, HER2: Negative) - Signed by Truitt Merle, MD on 08/17/2017       Malignant neoplasm of upper-outer quadrant of right breast in female, estrogen receptor positive ([place])   08/06/2017 Mammogram    IMPRESSION: 1. Palpable 2.1 cm mass in the 11 o'clock retroareolar right breast is highly suspicious for malignancy. 2. 1.3 cm markedly hypoechoic lymph node in the 10 o'clock position of the right breast axillary tail is indeterminate. Involvement with metastatic carcinoma cannot be excluded. 3. No suspicious lymph nodes are identified within the right axilla. 4. Extremely dense breast parenchyma. If breast biopsy is positive for malignancy, breast MRI is suggested. 5. No evidence of malignancy in the left breast.     08/11/2017 Receptors her2    Estrogen Receptor: 100%, POSITIVE, STRONG STAINING INTENSITY Progesterone Receptor: 100%, POSITIVE, STRONG STAINING INTENSITY Proliferation Marker Ki67: 10% HER2 NEGATIVE     08/11/2017 Initial Biopsy    Diagnosis 1. Breast, right, needle core biopsy, 11:00 o'clock - INVASIVE AND IN SITU LOBULAR CARCINOMA. - SEE COMMENT. 2. Breast, right, needle core biopsy, 10:00 o'clock lymph node - FIBROCYSTIC CHANGES. - LYMPH NODAL TISSUE IS NOT IDENTIFIED. - THERE IS NO EVIDENCE OF  MALIGNANCY.    08/17/2017 Initial Diagnosis    Malignant neoplasm of upper-outer quadrant of right breast in female, estrogen receptor positive ([place])    08/29/2017 Genetic Testing    [name] underwent genetic counseling and testing for hereditary cancer syndromes on 08/19/2017. 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   She was tested for 53 genes on the common hereditary cancer panel and the melanoma panel.  The Hereditary Gene Panel offered by [facility] includes sequencing and/or deletion duplication testing of the following 46 genes: APC, ATM, AXIN2, BARD1, BMPR1A, BRCA1, BRCA2, BRIP1, CDH1, CDKN2A (p14ARF), CDKN2A (p16INK4a), CHEK2, CTNNA1, DICER1, EPCAM (Deletion/duplication testing only), GREM1 (promoter region deletion/duplication testing only), KIT, MEN1, MLH1, MSH2, MSH3, MSH6, MUTYH, NBN, NF1, NHTL1, PALB2, PDGFRA, PMS2, POLD1, POLE, PTEN, RAD50, RAD51C, RAD51D, SDHB, SDHC, SDHD, SMAD4, SMARCA4. STK11, TP53, TSC1, TSC2, and VHL.  The following genes were evaluated for sequence changes only: SDHA and HOXB13 c.251G>A variant only.  The Melanoma panel offered by [facility] includes sequencing and/or deletion duplication testing of the following 12 genes: BAP1, BRCA1, BRCA2, BRIP1, CDK4, CDKN2A (p14ARF), CDKN2A (p16INK4a), MC1R, POT1, PTEN, RB1, TERT, and TP53.  The following gene was evaluated for sequence changes only: MITF (c.952G>A, p.GLU318Lys variant only). The report date is September 02, 2017.     10/07/2017 Surgery    1. Breast, simple mastectomy, Left - PSEUDOANGIOMATOUS STROMAL HYPERPLASIA ([place]). - FIBROCYSTIC CHANGE. - USUAL DUCTAL HYPERPLASIA. - NO MALIGNANCY IDENTIFIED. 2. Lymph node, sentinel, biopsy, Right Axillary #1 - FIBROADENOMA. - NO LYMPHOID TISSUE. - NO MALIGNANCY IDENTIFIED. 3. Lymph node, sentinel, biopsy, Right - ONE OF ONE LYMPH NODES IS NEGATIVE FOR CARCINOMA (0/1). 4. Lymph node, sentinel, biopsy, Right - ONE  OF  ONE LYMPH NODES IS NEGATIVE FOR CARCINOMA (0/1). 5. Lymph node, sentinel, biopsy, Right - MICROMETASTASIS IN ONE OF ONE LYMPH NODES (1/1). 6. Lymph node, sentinel, biopsy, Right - ONE OF ONE LYMPH NODES IS NEGATIVE FOR CARCINOMA (0/1). 7. Lymph node, sentinel, biopsy, Right Axillary #2 - ONE OF ONE LYMPH NODES IS NEGATIVE FOR CARCINOMA (0/1). 8. Lymph node, sentinel, biopsy, Right - ONE OF ONE LYMPH NODES IS NEGATIVE FOR CARCINOMA (0/1). 9. Lymph node, sentinel, biopsy, Right Axillary #3 - ONE OF ONE LYMPH NODES IS NEGATIVE FOR CARCINOMA (0/1). 10. Lymph node, sentinel, biopsy, Right Axillary #4 - ONE OF ONE LYMPH NODES IS NEGATIVE FOR CARCINOMA (0/1). 11. Lymph node, sentinel, biopsy, Right Axillary #5 - ONE OF ONE LYMPH NODES IS NEGATIVE FOR CARCINOMA (0/1). 12. Lymph node, sentinel, biopsy, Right - ONE OF ONE LYMPH NODES IS NEGATIVE FOR CARCINOMA (0/1). 13. Breast, simple mastectomy, Right - INVASIVE LOBULAR CARCINOMA, GRADE 2, SPANNING 3.0 CM. - LOBULAR NEOPLASIA (LOBULAR CARCINOMA IN SITU). - INVASIVE CARCINOMA IS BROADLY PRESENT AT THE POSTERIOR MARGIN. - SEE ONCOLOGY TABLE.  Oncotype Recurrence score: 18    10/07/2017 Pathology Results    Diagnosis 10/07/18 1. Breast, simple mastectomy, Left - PSEUDOANGIOMATOUS STROMAL HYPERPLASIA ([place]). - FIBROCYSTIC CHANGE. - USUAL DUCTAL HYPERPLASIA. - NO MALIGNANCY IDENTIFIED. 2. Lymph node, sentinel, biopsy, Right Axillary #1 - FIBROADENOMA. - NO LYMPHOID TISSUE. - NO MALIGNANCY IDENTIFIED. 3. Lymph node, sentinel, biopsy, Right - ONE OF ONE LYMPH NODES IS NEGATIVE FOR CARCINOMA (0/1). 4. Lymph node, sentinel, biopsy, Right - ONE OF ONE LYMPH NODES IS NEGATIVE FOR CARCINOMA (0/1). 5. Lymph node, sentinel, biopsy, Right - MICROMETASTASIS IN ONE OF ONE LYMPH NODES (1/1). 6. Lymph node, sentinel, biopsy, Right - ONE OF ONE LYMPH NODES IS NEGATIVE FOR CARCINOMA (0/1). 7. Lymph node, sentinel, biopsy, Right Axillary #2 - ONE OF  ONE LYMPH NODES IS NEGATIVE FOR CARCINOMA (0/1). 8. Lymph node, sentinel, biopsy, Right - ONE OF ONE LYMPH NODES IS NEGATIVE FOR CARCINOMA (0/1). 9. Lymph node, sentinel, biopsy, Right Axillary #3 - ONE OF ONE LYMPH NODES IS NEGATIVE FOR CARCINOMA (0/1). 10. Lymph node, sentinel, biopsy, Right Axillary #4 - ONE OF ONE LYMPH NODES IS NEGATIVE FOR CARCINOMA (0/1). 11. Lymph node, sentinel, biopsy, Right Axillary #5 - ONE OF ONE LYMPH NODES IS NEGATIVE FOR CARCINOMA (0/1). 12. Lymph node, sentinel, biopsy, Right - ONE OF ONE LYMPH NODES IS NEGATIVE FOR CARCINOMA (0/1). 1 of 4 FINAL for Dawn Ramos, Dawn Ramos [phone]) Diagnosis(continued) 13. Breast, simple mastectomy, Right - INVASIVE LOBULAR CARCINOMA, GRADE 2, SPANNING 3.0 CM. - LOBULAR NEOPLASIA (LOBULAR CARCINOMA IN SITU). - INVASIVE CARCINOMA IS BROADLY PRESENT AT THE POSTERIOR MARGIN. - SEE ONCOLOGY TABLE.     10/22/2017 Pathology Results    Soft tissue, debridement, left mastectomy flap - ULCERATED SKIN AND SUBCUTANEOUS TISSUE WITH NECROSIS AND ACUTE INFLAMMATION - NO MALIGNANCY IDENTIFIED    11/25/2017 - 01/13/2018 Radiation Therapy    Per Dr. Lisbeth Renshaw    11/11/2018 Surgery    removal of bilateral tissue expanders with placement of bilateral breast implant with Dr. Iran Planas     CURRENT THERAPY: Surveillance  INTERVAL HISTORY: Dawn Ramos is a 42 y.o. female who is here for follow-up. She had removal of bilateral tissue expanders with placement of bilateral breast implant with Dr. Iran Planas on 11/11/2018.  She is recovering well from surgery, with mild pain at the incision sites.  Today, she is here herself, she reports she is doing well.  Denies  any other pain, abdominal discomfort or other new symptoms.  She has good appetite and energy level, her mood is stable.  Pertinent positives and negatives of review of systems are listed and detailed within the above HPI.   REVIEW OF SYSTEMS:   Constitutional: Denies  fevers, chills or abnormal weight loss Eyes: Denies blurriness of vision Ears, nose, mouth, throat, and face: Denies mucositis or sore throat Respiratory: Denies cough, dyspnea or wheezes Cardiovascular: Denies palpitation, chest discomfort or lower extremity swelling Gastrointestinal:  Denies nausea, heartburn or change in bowel habits Skin: Denies abnormal skin rashes Lymphatics: Denies new lymphadenopathy or easy bruising Neurological:Denies numbness, tingling or new weaknesses Behavioral/Psych: Mood is stable, no new changes  All other systems were reviewed with the patient and are negative.  MEDICAL HISTORY:  Past Medical History:  Diagnosis Date  . ADHD   . Bipolar 1 disorder ([place])   . Dental crowns present   . Depression   . History of breast cancer 2018   right  . History of radiation therapy   . PONV (postoperative nausea and vomiting)     SURGICAL HISTORY: Past Surgical History:  Procedure Laterality Date  . BREAST RECONSTRUCTION WITH PLACEMENT OF TISSUE EXPANDER AND FLEX HD (ACELLULAR HYDRATED DERMIS) Bilateral 10/07/2017   Procedure: BREAST RECONSTRUCTION WITH PLACEMENT OF TISSUE EXPANDER AND ALLODERM;  Surgeon: Irene Limbo, MD;  Location: [facility];  Service: Plastics;  Laterality: Bilateral;  . INCISION AND DRAINAGE OF WOUND Left 10/22/2017   Procedure: DEBRIDEMENT OF LEFT MASTECTOMY FLAP;  Surgeon: Irene Limbo, MD;  Location: [facility];  Service: Plastics;  Laterality: Left;  . LIPOSUCTION WITH LIPOFILLING Bilateral 11/11/2018   Procedure: LIPOFILLING FROM THIGHS TO BILATERAL CHEST;  Surgeon: Irene Limbo, MD;  Location: [facility];  Service: Plastics;  Laterality: Bilateral;  . NIPPLE SPARING MASTECTOMY/SENTINAL LYMPH NODE BIOPSY/RECONSTRUCTION/PLACEMENT OF TISSUE EXPANDER Bilateral 10/07/2017   Procedure: BILATERAL NIPPLE SPARING MASTECTOMY WITH RIGHT SENTINEL LYMPH NODE BIOPSY;  Surgeon: Stark Klein, MD;  Location: [facility];  Service: General;  Laterality: Bilateral;  . ORIF WRIST FRACTURE Left   . REMOVAL OF BILATERAL TISSUE EXPANDERS WITH PLACEMENT OF BILATERAL BREAST IMPLANTS Bilateral 11/11/2018   Procedure: REMOVAL OF BILATERAL TISSUE EXPANDERS WITH PLACEMENT OF BILATERAL SILICONE BREAST IMPLANTS;  Surgeon: Irene Limbo, MD;  Location: [facility];  Service: Plastics;  Laterality: Bilateral;    I have reviewed the social history and family history with the patient and they are unchanged from previous note.  ALLERGIES:  is allergic to bee venom and macrobid [nitrofurantoin macrocrystal].  MEDICATIONS:  Current Outpatient Medications  Medication Sig Dispense Refill  . carbamazepine (CARBATROL) 100 MG 12 hr capsule Take 100 mg by mouth 2 (two) times daily.    [place] 500 MG CAPS Take 500 mg at bedtime as needed by mouth.    . escitalopram (LEXAPRO) 10 MG tablet Take 10 mg by mouth at bedtime.     Marland Kitchen ibuprofen (ADVIL,MOTRIN) 200 MG tablet Take 400 mg by mouth every 8 (eight) hours as needed for cramping.    . lisdexamfetamine (VYVANSE) 50 MG capsule Take 50 mg by mouth daily.    . Multiple Vitamin (MULTIVITAMIN) tablet Take 1 tablet by mouth daily.     No current facility-administered medications for this visit.     PHYSICAL EXAMINATION:  ECOG PERFORMANCE STATUS: 0 - Asymptomatic  Vitals:   11/21/18 1151  BP: (!) 123/93  Pulse: 90  Resp: 18  Temp: 98 F (  36.7 C)  SpO2: 100%   Filed Weights   11/21/18 1151  Weight: 126 lb 14.4 oz (57.6 kg)    GENERAL:alert, no distress and comfortable SKIN: skin color, texture, turgor are normal, no rashes or significant lesions EYES: normal, Conjunctiva are pink and non-injected, sclera clear OROPHARYNX:no exudate, no erythema and lips, buccal mucosa, and tongue normal  NECK: supple, thyroid normal size, non-tender, without nodularity LYMPH:  no palpable lymphadenopathy in the cervical, axillary or inguinal LUNGS: clear to  auscultation and percussion with normal breathing effort HEART: regular rate & rhythm and no murmurs and no lower extremity edema ABDOMEN:abdomen soft, non-tender and normal bowel sounds Musculoskeletal:no cyanosis of digits and no clubbing  NEURO: alert & oriented x 3 with fluent speech, no focal motor/sensory deficits BREAST: Status post bilateral mastectomy and instruction with implants, surgical incisions are healing well, without discharge or surrounding skin erythema.  No mass or axillary adenopathy.  LABORATORY DATA:  I have reviewed the data as listed CBC Latest Ref Rng & Units 11/21/2018 05/06/2018 09/30/2017  WBC 4.0 - 10.5 K/uL 5.2 4.2 5.7  Hemoglobin 12.0 - 15.0 g/dL 14.1 12.5 13.5  Hematocrit 36.0 - 46.0 % 42.3 37.6 40.1  Platelets 150 - 400 K/uL 310 267 222     CMP Latest Ref Rng & Units 11/21/2018 05/06/2018 09/30/2017  Glucose 70 - 99 mg/dL 97 90 96  BUN 6 - 20 mg/dL '16 23 18  '$ Creatinine 0.44 - 1.00 mg/dL 0.79 0.79 0.74  Sodium 135 - 145 mmol/L 141 140 137  Potassium 3.5 - 5.1 mmol/L 5.2(H) 3.8 4.2  Chloride 98 - 111 mmol/L 101 104 100(L)  CO2 22 - 32 mmol/L 32 29 27  Calcium 8.9 - 10.3 mg/dL 9.7 9.3 9.4  Total Protein 6.5 - 8.1 g/dL 7.3 6.7 6.7  Total Bilirubin 0.3 - 1.2 mg/dL 0.3 0.5 0.7  Alkaline Phos 38 - 126 U/L 104 69 47  AST 15 - 41 U/L 42(H) 18 24  ALT 0 - 44 U/L 61(H) 15 18      RADIOGRAPHIC STUDIES: I have personally reviewed the radiological images as listed and agreed with the findings in the report. No results found.   ASSESSMENT & PLAN:   Dawn Ramos is a 42 y.o. female with history of  1. Malignant neoplasm of upper-outer quadrant of right breast, invasive lobular carcinoma, pT2N72mM0, Stage IB, ER100%+, PR100%+, HER2: Negative, Grade 2, (+) LCIS  -Diagnosed in 2018. Treated with bilateral mastectomy and adjuvant radiation. -She was advised to start Tamoxifen, but she declined.  Due to her bipolar, and a likely mood swing from  antiestrogen therapy, she is very concerned about potential side effects. We discussed the benefit of antiestrogen therapy and side effects with her again, she clearly declined again. -We discussed possible late recurrence, continue breast cancer surveillance -He has had a bilateral mastectomy, no need routine screening mammogram. -labs reviewed, she has mild transaminitis, will repeat lab in 4 to 6 weeks.  -He is clinically doing well, exam was unremarkable, no concern for recurrence. -I will see her back in 6 months  2. Smoking -I previously referred her to [facility]oking cessation program. -I advised her to f/u with PCP and Psychiatry if needed -she has not quit smoking yet   3. Bipolar -f/u with Psychiatry -stable   4. Mild transaminitis -Her ALT and AST today are slightly elevated, total bilirubin is normal, liver exam was unremarkable, she is asymptomatic -We will repeat liver function panel in 4  to 6 weeks.  Plan  -she declined adjuvant Tamoxifen  -We will continue breast cancer surveillance, follow-up in 6 months with lab. -repeat labs in 4 to 6 weeks for mild transaminitis.  No problem-specific Assessment & Plan notes found for this encounter.   No orders of the defined types were placed in this encounter.  All questions were answered. The patient knows to call the clinic with any problems, questions or concerns. No barriers to learning was detected. I spent 15 minutes counseling the patient face to face. The total time spent in the appointment was 20 minutes and more than 50% was on counseling and review of test results   I, Noor Dweik am acting as scribe for Dr. Truitt Merle.  I have reviewed the above documentation for accuracy and completeness, and I agree with the above.      Truitt Merle, MD 11/21/2018

## 2018-11-21 ENCOUNTER — Inpatient Hospital Stay (HOSPITAL_BASED_OUTPATIENT_CLINIC_OR_DEPARTMENT_OTHER): Payer: BLUE CROSS/BLUE SHIELD | Admitting: Hematology

## 2018-11-21 ENCOUNTER — Telehealth: Payer: Self-pay | Admitting: Hematology

## 2018-11-21 ENCOUNTER — Inpatient Hospital Stay: Payer: BLUE CROSS/BLUE SHIELD | Attending: Hematology

## 2018-11-21 VITALS — BP 123/93 | HR 90 | Temp 98.0°F | Resp 18 | Ht 67.0 in | Wt 126.9 lb

## 2018-11-21 DIAGNOSIS — Z79899 Other long term (current) drug therapy: Secondary | ICD-10-CM

## 2018-11-21 DIAGNOSIS — Z9012 Acquired absence of left breast and nipple: Secondary | ICD-10-CM

## 2018-11-21 DIAGNOSIS — R740 Nonspecific elevation of levels of transaminase and lactic acid dehydrogenase [LDH]: Secondary | ICD-10-CM

## 2018-11-21 DIAGNOSIS — Z87891 Personal history of nicotine dependence: Secondary | ICD-10-CM | POA: Insufficient documentation

## 2018-11-21 DIAGNOSIS — F319 Bipolar disorder, unspecified: Secondary | ICD-10-CM

## 2018-11-21 DIAGNOSIS — C50411 Malignant neoplasm of upper-outer quadrant of right female breast: Secondary | ICD-10-CM | POA: Diagnosis not present

## 2018-11-21 DIAGNOSIS — Z170 Estrogen receptor positive status [ER+]: Secondary | ICD-10-CM | POA: Insufficient documentation

## 2018-11-21 DIAGNOSIS — Z923 Personal history of irradiation: Secondary | ICD-10-CM | POA: Diagnosis not present

## 2018-11-21 LAB — CBC WITH DIFFERENTIAL (CANCER CENTER ONLY)
Abs Immature Granulocytes: 0.01 10*3/uL (ref 0.00–0.07)
BASOS ABS: 0 10*3/uL (ref 0.0–0.1)
Basophils Relative: 1 %
EOS PCT: 2 %
Eosinophils Absolute: 0.1 10*3/uL (ref 0.0–0.5)
HEMATOCRIT: 42.3 % (ref 36.0–46.0)
HEMOGLOBIN: 14.1 g/dL (ref 12.0–15.0)
IMMATURE GRANULOCYTES: 0 %
LYMPHS ABS: 1 10*3/uL (ref 0.7–4.0)
LYMPHS PCT: 19 %
MCH: 31.2 pg (ref 26.0–34.0)
MCHC: 33.3 g/dL (ref 30.0–36.0)
MCV: 93.6 fL (ref 80.0–100.0)
Monocytes Absolute: 0.4 10*3/uL (ref 0.1–1.0)
Monocytes Relative: 8 %
NRBC: 0 % (ref 0.0–0.2)
Neutro Abs: 3.6 10*3/uL (ref 1.7–7.7)
Neutrophils Relative %: 70 %
Platelet Count: 310 10*3/uL (ref 150–400)
RBC: 4.52 MIL/uL (ref 3.87–5.11)
RDW: 12 % (ref 11.5–15.5)
WBC Count: 5.2 10*3/uL (ref 4.0–10.5)

## 2018-11-21 LAB — CMP (CANCER CENTER ONLY)
ALBUMIN: 4.1 g/dL (ref 3.5–5.0)
ALT: 61 U/L — AB (ref 0–44)
AST: 42 U/L — ABNORMAL HIGH (ref 15–41)
Alkaline Phosphatase: 104 U/L (ref 38–126)
Anion gap: 8 (ref 5–15)
BUN: 16 mg/dL (ref 6–20)
CHLORIDE: 101 mmol/L (ref 98–111)
CO2: 32 mmol/L (ref 22–32)
CREATININE: 0.79 mg/dL (ref 0.44–1.00)
Calcium: 9.7 mg/dL (ref 8.9–10.3)
GFR, Estimated: >60 mL/min (ref >60)
GLUCOSE: 97 mg/dL (ref 70–99)
Potassium: 5.2 mmol/L — ABNORMAL HIGH (ref 3.5–5.1)
SODIUM: 141 mmol/L (ref 135–145)
Total Bilirubin: 0.3 mg/dL (ref 0.3–1.2)
Total Protein: 7.3 g/dL (ref 6.5–8.1)

## 2018-11-21 NOTE — Telephone Encounter (Signed)
Patient declined avs and calendat

## 2018-12-19 ENCOUNTER — Inpatient Hospital Stay: Payer: BLUE CROSS/BLUE SHIELD

## 2018-12-19 DIAGNOSIS — Z170 Estrogen receptor positive status [ER+]: Principal | ICD-10-CM

## 2018-12-19 DIAGNOSIS — C50411 Malignant neoplasm of upper-outer quadrant of right female breast: Secondary | ICD-10-CM | POA: Diagnosis not present

## 2018-12-19 LAB — CMP (CANCER CENTER ONLY)
ALBUMIN: 4.3 g/dL (ref 3.5–5.0)
ALK PHOS: 71 U/L (ref 38–126)
ALT: 21 U/L (ref 0–44)
ANION GAP: 9 (ref 5–15)
AST: 20 U/L (ref 15–41)
BUN: 11 mg/dL (ref 6–20)
CO2: 27 mmol/L (ref 22–32)
Calcium: 9.2 mg/dL (ref 8.9–10.3)
Chloride: 105 mmol/L (ref 98–111)
Creatinine: 0.77 mg/dL (ref 0.44–1.00)
GFR, Estimated: >60 mL/min (ref >60)
Glucose, Bld: 89 mg/dL (ref 70–99)
POTASSIUM: 4.3 mmol/L (ref 3.5–5.1)
SODIUM: 141 mmol/L (ref 135–145)
TOTAL PROTEIN: 7 g/dL (ref 6.5–8.1)
Total Bilirubin: 0.5 mg/dL (ref 0.3–1.2)

## 2018-12-19 LAB — CBC WITH DIFFERENTIAL (CANCER CENTER ONLY)
ABS IMMATURE GRANULOCYTES: 0.01 10*3/uL (ref 0.00–0.07)
BASOS ABS: 0 10*3/uL (ref 0.0–0.1)
BASOS PCT: 1 %
EOS ABS: 0.1 10*3/uL (ref 0.0–0.5)
Eosinophils Relative: 2 %
HCT: 40.4 % (ref 36.0–46.0)
Hemoglobin: 13.3 g/dL (ref 12.0–15.0)
IMMATURE GRANULOCYTES: 0 %
LYMPHS ABS: 1.1 10*3/uL (ref 0.7–4.0)
Lymphocytes Relative: 24 %
MCH: 30.5 pg (ref 26.0–34.0)
MCHC: 32.9 g/dL (ref 30.0–36.0)
MCV: 92.7 fL (ref 80.0–100.0)
Monocytes Absolute: 0.4 10*3/uL (ref 0.1–1.0)
Monocytes Relative: 8 %
NEUTROS ABS: 3 10*3/uL (ref 1.7–7.7)
NRBC: 0 % (ref 0.0–0.2)
Neutrophils Relative %: 65 %
PLATELETS: 243 10*3/uL (ref 150–400)
RBC: 4.36 MIL/uL (ref 3.87–5.11)
RDW: 12.2 % (ref 11.5–15.5)
WBC Count: 4.6 10*3/uL (ref 4.0–10.5)

## 2018-12-22 ENCOUNTER — Telehealth: Payer: Self-pay

## 2018-12-22 NOTE — Telephone Encounter (Signed)
Spoke with patient per Dr. Burr Medico notified her lab results, potassium and liver function tests are back to normal, no concerns, patient verbalized an understanding and appreciated the call.

## 2018-12-22 NOTE — Telephone Encounter (Signed)
-----   Message from Truitt Merle, MD sent at 12/21/2018  9:44 AM EST ----- Please let pt know the lab results, K and LFTs are back to normal, no concerns, thanks   Truitt Merle  12/21/2018

## 2019-05-18 ENCOUNTER — Telehealth: Payer: Self-pay | Admitting: Hematology

## 2019-05-18 NOTE — Telephone Encounter (Signed)
Called patient regarding upcoming [facility] appointment, patient has new insurance and needs this appointment to be cancelled.  Message to provider.

## 2019-05-24 ENCOUNTER — Ambulatory Visit: Payer: BLUE CROSS/BLUE SHIELD | Admitting: Hematology

## 2019-05-24 ENCOUNTER — Other Ambulatory Visit: Payer: BLUE CROSS/BLUE SHIELD

## 2024-02-21 NOTE — Progress Notes (Signed)
" °  Subjective Patient ID: Dawn Ramos is a 48 y.o. female.  Chief Complaint  Patient presents with   Cough   Fatigue    Patient states she has been experiencing cough and fatigue since last Thursday. Patient states her partner has recently been diagnosed with the flu. Patient states she took one of her partners tamiflu today.     The following information was reviewed by members of the visit team:  Tobacco  Allergies  Meds  OB Status      48 year old female presents for evaluation of cough, fatigue, body aches which began 5 days ago.  States she had began to feel better.  Her partner recently tested positive for the flu and was prescribed Tamiflu.  She is concerned that she may also have flu and wonders if she needs medication for same.  No recent fever, chills.  No associated shortness of breath.  No nausea, vomiting, diarrhea.  Has been using over-the-counter medicines for symptoms    Review of Systems  Constitutional:  Positive for fatigue. Negative for chills and fever.  HENT:  Negative for congestion, ear pain, rhinorrhea, sinus pressure, sinus pain and sore throat.   Respiratory:  Positive for cough. Negative for chest tightness and shortness of breath.   Cardiovascular:  Negative for chest pain.  Gastrointestinal:  Negative for diarrhea, nausea and vomiting.  Musculoskeletal:  Positive for myalgias. Negative for arthralgias, neck pain and neck stiffness.  Skin:  Negative for rash.  Neurological:  Negative for dizziness and headaches.  Hematological:  Negative for adenopathy. Does not bruise/bleed easily.    Objective Physical Exam Vitals reviewed.  Constitutional:      General: She is not in acute distress.    Appearance: Normal appearance. She is not ill-appearing, toxic-appearing or diaphoretic.  HENT:     Right Ear: Tympanic membrane, ear canal and external ear normal.     Left Ear: Tympanic membrane, ear canal and external ear normal.     Nose: Nose  normal. No rhinorrhea.     Mouth/Throat:     Mouth: Mucous membranes are moist.     Pharynx: No oropharyngeal exudate or posterior oropharyngeal erythema.  Eyes:     Conjunctiva/sclera: Conjunctivae normal.  Cardiovascular:     Rate and Rhythm: Normal rate and regular rhythm.     Pulses: Normal pulses.  Pulmonary:     Effort: Pulmonary effort is normal. No respiratory distress.     Breath sounds: Normal breath sounds. No stridor. No wheezing, rhonchi or rales.  Musculoskeletal:     Cervical back: Normal range of motion and neck supple.  Skin:    General: Skin is warm and dry.     Findings: No rash.  Neurological:     Mental Status: She is alert and oriented to person, place, and time.  Psychiatric:        Mood and Affect: Mood normal.     Assessment/Plan 48 year old female with 5-day history of bodyaches, fatigue, cough, concerned for flu POC flu test is positive for flu A Given duration of symptoms Tamiflu does not appear to be indicated Vital signs are within normal limits, patient in no acute distress Recommend over-the-counter medicines for symptoms as needed Follow-up with PCP or return to clinic as needed for persistent symptoms Urgent Care Disposition:  Home Care   Electronically signed: Franky Floria Finder, PA-C 02/21/2024  5:58 PM   "

## 2025-01-09 ENCOUNTER — Ambulatory Visit: Attending: Cardiology | Admitting: Cardiology

## 2025-01-09 ENCOUNTER — Encounter: Payer: Self-pay | Admitting: Cardiology

## 2025-01-09 VITALS — BP 121/82 | HR 89 | Resp 16 | Ht 67.0 in | Wt 146.9 lb

## 2025-01-09 DIAGNOSIS — F909 Attention-deficit hyperactivity disorder, unspecified type: Secondary | ICD-10-CM

## 2025-01-09 DIAGNOSIS — F1591 Other stimulant use, unspecified, in remission: Secondary | ICD-10-CM

## 2025-01-09 DIAGNOSIS — Z853 Personal history of malignant neoplasm of breast: Secondary | ICD-10-CM | POA: Diagnosis not present

## 2025-01-09 DIAGNOSIS — F17210 Nicotine dependence, cigarettes, uncomplicated: Secondary | ICD-10-CM | POA: Diagnosis not present

## 2025-01-09 NOTE — Patient Instructions (Addendum)
 Medication Instructions:  Your physician recommends that you continue on your current medications as directed. Please refer to the Current Medication list given to you today.  *If you need a refill on your cardiac medications before your next appointment, please call your pharmacy*  Lab Work: None ordered If you have labs (blood work) drawn today and your tests are completely normal, you will receive your results only by: MyChart Message (if you have MyChart) OR A paper copy in the mail If you have any lab test that is abnormal or we need to change your treatment, we will call you to review the results.  Testing/Procedures: ECHOCARDIOGRAM  Follow-Up: At [facility], you and your health needs are our priority.  As part of our continuing mission to provide you with exceptional heart care, our providers are all part of one team.  This team includes your primary Cardiologist (physician) and Advanced Practice Providers or APPs (Physician Assistants and Nurse Practitioners) who all work together to provide you with the care you need, when you need it.  Your next appointment:   As Needed  Provider:   Madonna Large, DO    We recommend signing up for the patient portal called MyChart.  Sign up information is provided on this After Visit Summary.  MyChart is used to connect with patients for Virtual Visits (Telemedicine).  Patients are able to view lab/test results, encounter notes, upcoming appointments, etc.  Non-urgent messages can be sent to your provider as well.   To learn more about what you can do with MyChart, go to [email or web address].   Other Instructions Your physician has requested that you have an echocardiogram. Echocardiography is a painless test that uses sound waves to create images of your heart. It provides your doctor with information about the size and shape of your heart and how well your hearts chambers and valves are working. This procedure takes approximately  one hour. There are no restrictions for this procedure. Please do NOT wear cologne, perfume, aftershave, or lotions (deodorant is allowed). Please arrive 15 minutes prior to your appointment time.  Please note: We ask at that you not bring children with you during ultrasound (echo/ vascular) testing. Due to room size and safety concerns, children are not allowed in the ultrasound rooms during exams. Our front office staff cannot provide observation of children in our lobby area while testing is being conducted. An adult accompanying a patient to their appointment will only be allowed in the ultrasound room at the discretion of the ultrasound technician under special circumstances. We apologize for any inconvenience.

## 2025-01-09 NOTE — Progress Notes (Signed)
 "   Cardiology Office Note:    Date:  01/09/2025  NAME:  Dawn Ramos    MRN: [id number] DOB:  September 08, 1976   PCP: Rosina Amy PA  Former Cardiology Providers: NA Primary Cardiologist:  Madonna Large, DO, [facility] (established care 01/09/2025) Electrophysiologist:  None   Referring MD: [facility]  Reason of Consult: Establish care  Chief Complaint  Patient presents with   Establish Care    Wanting to restart ADHD medications    History of Present Illness:    Dawn Ramos is a 49 y.o. Caucasian female whose past medical history and cardiovascular risk factors includes: History of breast cancer status post bilateral mastectomies with implant-based reconstruction, smoking, history of methamphetamine addiction.   She presents for an EKG to assess cardiac health before restarting stimulant medication. She was referred by [facility] for an EKG as part of the evaluation to restart ADHD medication.  She has ADHD and previously used Vyvanse , Adderall, and Ritalin. Stimulants were stopped after a methamphetamine addiction. She has been abstinent from methamphetamine for about 1.5 years and wishes to restart stimulants under [facility] care.  She had breast cancer treated with bilateral mastectomies and radiation. She did not receive tamoxifen or chemotherapy. She denies prior myocardial infarction, stroke, congestive heart failure, or other known cardiac disease. She is taking Wellbutrin prescribed by [facility] for smoking cessation. She stopped smoking 2 days ago.  No structured exercise program but an avid biker (she commutes via biking).   No first degree relatives with premature coronary disease or sudden cardiac death.  Current Medications: Active Medications[1]   Allergies:    Bee venom and Macrobid [nitrofurantoin macrocrystal]   Past Medical History: Past Medical History:  Diagnosis Date   ADHD    Bipolar 1 disorder (HCC)     Dental crowns present    Depression    History of breast cancer 2018   right   History of radiation therapy    PONV (postoperative nausea and vomiting)     Past Surgical History: Past Surgical History:  Procedure Laterality Date   BREAST RECONSTRUCTION WITH PLACEMENT OF TISSUE EXPANDER AND FLEX HD (ACELLULAR HYDRATED DERMIS) Bilateral 10/07/2017   Procedure: BREAST RECONSTRUCTION WITH PLACEMENT OF TISSUE EXPANDER AND ALLODERM;  Surgeon: Arelia Filippo, MD;  Location: [facility];  Service: Plastics;  Laterality: Bilateral;   INCISION AND DRAINAGE OF WOUND Left 10/22/2017   Procedure: DEBRIDEMENT OF LEFT MASTECTOMY FLAP;  Surgeon: Arelia Filippo, MD;  Location: [facility];  Service: Plastics;  Laterality: Left;   LIPOSUCTION WITH LIPOFILLING Bilateral 11/11/2018   Procedure: LIPOFILLING FROM THIGHS TO BILATERAL CHEST;  Surgeon: Arelia Filippo, MD;  Location: [facility];  Service: Plastics;  Laterality: Bilateral;   NIPPLE SPARING MASTECTOMY/SENTINAL LYMPH NODE BIOPSY/RECONSTRUCTION/PLACEMENT OF TISSUE EXPANDER Bilateral 10/07/2017   Procedure: BILATERAL NIPPLE SPARING MASTECTOMY WITH RIGHT SENTINEL LYMPH NODE BIOPSY;  Surgeon: Aron Shoulders, MD;  Location: [facility];  Service: General;  Laterality: Bilateral;   ORIF WRIST FRACTURE Left    REMOVAL OF BILATERAL TISSUE EXPANDERS WITH PLACEMENT OF BILATERAL BREAST IMPLANTS Bilateral 11/11/2018   Procedure: REMOVAL OF BILATERAL TISSUE EXPANDERS WITH PLACEMENT OF BILATERAL SILICONE BREAST IMPLANTS;  Surgeon: Arelia Filippo, MD;  Location: [facility];  Service: Plastics;  Laterality: Bilateral;    Social History: Social History[2]  Family History: Family History  Problem Relation Age of Onset   Lymphoma Paternal Uncle 57   Melanoma Paternal Uncle 53  ROS:   Review of Systems  Cardiovascular:  Negative for chest pain, claudication, irregular heartbeat, leg swelling, near-syncope, orthopnea,  palpitations, paroxysmal nocturnal dyspnea and syncope.  Respiratory:  Negative for shortness of breath.   Hematologic/Lymphatic: Negative for bleeding problem.    Studies Reviewed:   EKG: EKG Interpretation Date/Time:  Tuesday January 09 2025 11:39:16 EST Ventricular Rate:  94 PR Interval:  152 QRS Duration:  82 QT Interval:  356 QTC Calculation: 445 R Axis:   77  Text Interpretation: Normal sinus rhythm Normal ECG No previous ECGs available Confirmed by Michele Richardson [phone]) on 01/09/2025 12:02:47 PM  Echocardiogram: NA    Labs:    Latest Ref Rng & Units 12/19/2018    2:30 PM 11/21/2018   11:26 AM 05/06/2018    3:01 PM  CBC  WBC 4.0 - 10.5 K/uL 4.6  5.2  4.2   Hemoglobin 12.0 - 15.0 g/dL 86.6  85.8  87.4   Hematocrit 36.0 - 46.0 % 40.4  42.3  37.6   Platelets 150 - 400 K/uL 243  310  267        Latest Ref Rng & Units 12/19/2018    2:30 PM 11/21/2018   11:26 AM 05/06/2018    3:01 PM  BMP  Glucose 70 - 99 mg/dL 89  97  90   BUN 6 - 20 mg/dL 11  16  23    Creatinine 0.44 - 1.00 mg/dL 9.22  9.20  9.20   Sodium 135 - 145 mmol/L 141  141  140   Potassium 3.5 - 5.1 mmol/L 4.3  5.2  3.8   Chloride 98 - 111 mmol/L 105  101  104   CO2 22 - 32 mmol/L 27  32  29   Calcium 8.9 - 10.3 mg/dL 9.2  9.7  9.3       Latest Ref Rng & Units 12/19/2018    2:30 PM 11/21/2018   11:26 AM 05/06/2018    3:01 PM  CMP  Glucose 70 - 99 mg/dL 89  97  90   BUN 6 - 20 mg/dL 11  16  23    Creatinine 0.44 - 1.00 mg/dL 9.22  9.20  9.20   Sodium 135 - 145 mmol/L 141  141  140   Potassium 3.5 - 5.1 mmol/L 4.3  5.2  3.8   Chloride 98 - 111 mmol/L 105  101  104   CO2 22 - 32 mmol/L 27  32  29   Calcium 8.9 - 10.3 mg/dL 9.2  9.7  9.3   Total Protein 6.5 - 8.1 g/dL 7.0  7.3  6.7   Total Bilirubin 0.3 - 1.2 mg/dL 0.5  0.3  0.5   Alkaline Phos 38 - 126 U/L 71  104  69   AST 15 - 41 U/L 20  42  18   ALT 0 - 44 U/L 21  61  15     No results found for: CHOL, HDL, LDLCALC, LDLDIRECT, TRIG,  CHOLHDL No results for input(s): LIPOA in the last 8760 hours. No components found for: NTPROBNP No results for input(s): PROBNP in the last 8760 hours. No results for input(s): TSH in the last 8760 hours.  Physical Exam:    Today's Vitals   01/09/25 1143  BP: 121/82  Pulse: 89  Resp: 16  SpO2: 98%  Weight: 146 lb 14.4 oz (66.6 kg)  Height: 5' 7 (1.702 m)   Body mass index is 23.01 kg/m. Wt Readings from Last  3 Encounters:  01/09/25 146 lb 14.4 oz (66.6 kg)  11/21/18 126 lb 14.4 oz (57.6 kg)  11/11/18 123 lb 10.9 oz (56.1 kg)    Physical Exam  Constitutional: No distress.  hemodynamically stable  Neck: No JVD present.  Cardiovascular: Normal rate, regular rhythm, S1 normal and S2 normal. Exam reveals no gallop, no S3 and no S4.  No murmur heard. Pulmonary/Chest: Effort normal and breath sounds normal. No stridor. She has no wheezes. She has no rales.  Musculoskeletal:        General: No edema.     Cervical back: Neck supple.  Skin: Skin is warm.     Impression & Recommendation(s):  Impression:   ICD-10-CM   1. Attention deficit hyperactivity disorder (ADHD), unspecified ADHD type  F90.9 EKG 12-Lead    2. History of methamphetamine use  F15.91     3. History of breast cancer  Z85.3 ECHOCARDIOGRAM COMPLETE    4. Cigarette smoker  F17.210        Recommendation(s):  Attention deficit hyperactivity disorder (ADHD), unspecified ADHD type History of methamphetamine use Patient is been on pharmacological therapy for many years.   She has been on Ritalin, Adderall, and most recent Vyvanse . Vyvanse  was discontinued once she became addicted to methamphetamines. Now she has been sober for the last 1.5 years and wishes to go back on ADHD medications. She was referred to cardiology for an EKG EKG shows sinus rhythm without underlying ischemia or injury pattern. Recommend echocardiogram to evaluate LVEF and structural heart disease given her history of breast  cancer status post radiation. Decision to restart ADHD medication will be deferred to her provider.   As long as the echocardiogram is within acceptable limits no additional workup warranted  History of breast cancer Status post bilateral mastectomy/radiation followed by reconstruction Patient chose not to be on tamoxifen Recommend that she follows up with her provider regularly as scheduled Echo will be ordered to evaluate for structural heart disease and left ventricular systolic function.  Cigarette smoker Stopped 2 days ago. Currently on Wellbutrin. Motivated. Working with her provider at [facility] care.  Orders Placed:  Orders Placed This Encounter  Procedures   EKG 12-Lead   ECHOCARDIOGRAM COMPLETE    Standing Status:   Future    Expected Date:   02/09/2025    Expiration Date:   01/09/2026    Where [facility] this [facility] performed[facility]    Does the patient weigh less than or greater than 250 lbs?:   Patient weighs less than 250 lbs    Perflutren DEFINITY (image enhancing agent) should be administered unless hypersensitivity or allergy exist:   Administer Perflutren    Reason for exam-Echo:   Other-Full Diagnosis List    Full ICD-10/Reason for Exam:   Breast cancer (HCC) [781129]     Final Medication List:   No orders of the defined types were placed in this encounter.   Current Medications[3]  Consent:   N/A  Disposition:   As needed or sooner if needed based on the results of echo.  Patient may be asked to follow-up sooner based on the results of the above-mentioned testing.   Signed, Madonna Michele HAS, [facility]  A Division of [facility] [address] [place], [place] [address]      [1]  Current Meds  Medication Sig   buPROPion (WELLBUTRIN XL) 300 MG 24 hr tablet Take 300 mg by mouth every  morning.   escitalopram  (LEXAPRO ) 20 MG tablet Take 20 mg by mouth every morning.   guanFACINE (TENEX) 2 MG  tablet Take 2 mg by mouth at bedtime.  [2]  Social History Tobacco Use   Smoking status: Former    Current packs/day: 0.00    Types: Cigarettes    Quit date: 07/20/2018    Years since quitting: 6.4   Smokeless tobacco: Never  Vaping Use   Vaping status: Never Used  Substance Use Topics   Alcohol use: No   Drug use: Not Currently  [3]  Current Outpatient Medications:    buPROPion (WELLBUTRIN XL) 300 MG 24 hr tablet, Take 300 mg by mouth every morning., Disp: , Rfl:    escitalopram  (LEXAPRO ) 20 MG tablet, Take 20 mg by mouth every morning., Disp: , Rfl:    guanFACINE (TENEX) 2 MG tablet, Take 2 mg by mouth at bedtime., Disp: , Rfl:   "

## 2025-01-10 ENCOUNTER — Ambulatory Visit (HOSPITAL_COMMUNITY)
Admission: RE | Admit: 2025-01-10 | Discharge: 2025-01-10 | Disposition: A | Discharge status: Home / Self Care | Source: Ambulatory Visit | Attending: Cardiology | Admitting: Cardiology

## 2025-01-10 DIAGNOSIS — Z853 Personal history of malignant neoplasm of breast: Secondary | ICD-10-CM | POA: Insufficient documentation

## 2025-01-10 LAB — ECHOCARDIOGRAM COMPLETE
Area-P 1/2: 3.77 cm2
S' Lateral: 2.6 cm

## 2025-01-12 ENCOUNTER — Ambulatory Visit: Payer: Self-pay | Admitting: Cardiology
